# Patient Record
Sex: Female | Born: 1955 | Race: White | Hispanic: No | Marital: Single | Smoking: Former smoker
Health system: Southern US, Community
[De-identification: ages and names within clinical notes are randomized; demographics above are authoritative.]

## PROBLEM LIST (undated history)

## (undated) DIAGNOSIS — K219 Gastro-esophageal reflux disease without esophagitis: Secondary | ICD-10-CM

## (undated) DIAGNOSIS — R519 Headache, unspecified: Secondary | ICD-10-CM

## (undated) DIAGNOSIS — G4730 Sleep apnea, unspecified: Secondary | ICD-10-CM

## (undated) DIAGNOSIS — M1990 Unspecified osteoarthritis, unspecified site: Secondary | ICD-10-CM

## (undated) DIAGNOSIS — R51 Headache: Secondary | ICD-10-CM

## (undated) DIAGNOSIS — F419 Anxiety disorder, unspecified: Secondary | ICD-10-CM

## (undated) DIAGNOSIS — I10 Essential (primary) hypertension: Secondary | ICD-10-CM

## (undated) HISTORY — DX: Essential (primary) hypertension: I10

## (undated) HISTORY — PX: NASAL SINUS SURGERY: SHX719

## (undated) HISTORY — PX: CHOLECYSTECTOMY: SHX55

## (undated) HISTORY — PX: ABDOMINAL HYSTERECTOMY: SHX81

## (undated) HISTORY — PX: THROAT SURGERY: SHX803

## (undated) HISTORY — PX: TUBAL LIGATION: SHX77

## (undated) HISTORY — PX: OTHER SURGICAL HISTORY: SHX169

---

## 1998-08-31 ENCOUNTER — Encounter: Payer: Self-pay | Admitting: Specialist

## 1998-08-31 ENCOUNTER — Ambulatory Visit (HOSPITAL_COMMUNITY): Admission: RE | Admit: 1998-08-31 | Discharge: 1998-08-31 | Payer: Self-pay | Admitting: Specialist

## 1999-06-17 ENCOUNTER — Ambulatory Visit (HOSPITAL_COMMUNITY): Admission: RE | Admit: 1999-06-17 | Discharge: 1999-06-17 | Payer: Self-pay | Admitting: *Deleted

## 2000-09-01 ENCOUNTER — Encounter: Payer: Self-pay | Admitting: Pulmonary Disease

## 2000-09-01 ENCOUNTER — Ambulatory Visit (HOSPITAL_COMMUNITY): Admission: RE | Admit: 2000-09-01 | Discharge: 2000-09-01 | Payer: Self-pay | Admitting: Pulmonary Disease

## 2000-10-06 ENCOUNTER — Ambulatory Visit (HOSPITAL_COMMUNITY): Admission: RE | Admit: 2000-10-06 | Discharge: 2000-10-06 | Payer: Self-pay | Admitting: Gastroenterology

## 2000-11-23 ENCOUNTER — Encounter: Payer: Self-pay | Admitting: Surgery

## 2000-11-25 ENCOUNTER — Observation Stay (HOSPITAL_COMMUNITY): Admission: EM | Admit: 2000-11-25 | Discharge: 2000-11-27 | Payer: Self-pay | Admitting: Surgery

## 2001-04-20 ENCOUNTER — Encounter: Admission: RE | Admit: 2001-04-20 | Discharge: 2001-04-20 | Payer: Self-pay | Admitting: Family Medicine

## 2001-04-20 ENCOUNTER — Encounter: Payer: Self-pay | Admitting: Family Medicine

## 2001-09-07 ENCOUNTER — Encounter: Payer: Self-pay | Admitting: Family Medicine

## 2001-09-07 ENCOUNTER — Encounter: Admission: RE | Admit: 2001-09-07 | Discharge: 2001-09-07 | Payer: Self-pay | Admitting: Family Medicine

## 2004-12-30 ENCOUNTER — Encounter: Admission: RE | Admit: 2004-12-30 | Discharge: 2004-12-30 | Payer: Self-pay | Admitting: Gastroenterology

## 2005-05-26 ENCOUNTER — Encounter: Admission: RE | Admit: 2005-05-26 | Discharge: 2005-05-26 | Payer: Self-pay | Admitting: Family Medicine

## 2007-08-27 ENCOUNTER — Encounter: Admission: RE | Admit: 2007-08-27 | Discharge: 2007-08-27 | Payer: Self-pay | Admitting: Family Medicine

## 2008-09-11 ENCOUNTER — Encounter: Admission: RE | Admit: 2008-09-11 | Discharge: 2008-09-11 | Payer: Self-pay | Admitting: Family Medicine

## 2009-09-20 ENCOUNTER — Encounter: Admission: RE | Admit: 2009-09-20 | Discharge: 2009-09-20 | Payer: Self-pay | Admitting: Family Medicine

## 2010-10-20 ENCOUNTER — Encounter: Payer: Self-pay | Admitting: Family Medicine

## 2011-01-20 ENCOUNTER — Ambulatory Visit
Admission: RE | Admit: 2011-01-20 | Discharge: 2011-01-20 | Disposition: A | Discharge status: Home / Self Care | Payer: BC Managed Care – PPO | Source: Ambulatory Visit | Attending: Family Medicine | Admitting: Family Medicine

## 2011-01-20 ENCOUNTER — Other Ambulatory Visit: Payer: Self-pay | Admitting: Family Medicine

## 2011-01-20 DIAGNOSIS — R51 Headache: Secondary | ICD-10-CM

## 2011-02-14 NOTE — Procedures (Signed)
[facility]  Patient:    [name], [name]                       MRN: [id number] Proc. Date: 10/08/00 Adm. Date:  [id number] Disc. Date: [id number] Attending:  Judeth Cornfield                           Procedure Report  HISTORY:  Mrs. Zenz underwent an esophageal manometry because of chronic reflux.  Test is performed with a pull-through technique.  FINDINGS: 1. Upper esophageal sphincter pressure and relaxation were normal. 2. There were normal peristaltic contractions throughout the body of the    esophagus. 3. LES resting pressure was 49% with 85% relaxation.  IMPRESSION:  Normal esophageal manometry. DD:  10/08/00 TD:  10/08/00 Job: [id number]

## 2011-02-14 NOTE — Discharge Summary (Signed)
[facility]  Patient:    [name], [name]                       MRN: [id number] Adm. Date:  [id number] Disc. Date: [id number] Attending:  Katha Cabal                           Discharge Summary  ADMISSION DIAGNOSIS:  Gastroesophageal reflux disease.  DISCHARGE DIAGNOSIS:  Gastroesophageal reflux disease, status post laparoscopic Nissen fundoplication.  COURSE IN THE HOSPITAL:  [name] was an a.m. admission on November 25, 2000, and underwent a laparoscopic Nissen fundoplication over a #56 dilator on November 25, 2000.  She did well.  She was begun on liquids on November 26, 2000, and is ready for discharge on November 27, 2000.  CONDITION ON DISCHARGE:  Good. DD:  11/27/00 TD:  11/29/00 Job: [id number]

## 2011-02-14 NOTE — Op Note (Signed)
[facility]  Patient:    [name], [name]                       MRN: [id number] Proc. Date: 11/25/00 Adm. Date:  [id number] Attending:  Katha Cabal CC:         Dr. Carley Hammed D. Arlyce Dice, M.D. [facility]  Teena Irani. Arlyce Dice, M.D.  Veverly Fells. Arletha Grippe, M.D.   Operative Report  CCS#:  [id number]  PREOPERATIVE DIAGNOSES:  Gastroesophageal reflux disease with hoarseness.  POSTOPERATIVE DIAGNOSES:  Gastroesophageal reflux disease with hoarseness.  PROCEDURE:  Laparoscopic Nissen fundoplication over a #56 dilator with a f08 and simple suture closure of the diaphragm and the wrap tacked to the right crus.  SURGEON:  Luretha Murphy.  ASSISTANT:  [place].  ANESTHESIA:  General endotracheal after fiberoptic intubation.  PREOPERATIVE INDICATIONS:  [name] is a 55 year old lady with a history of acid reflux going back at least five or six years. She has had water brash and coughing and more problems with hoarseness. She has been taking proton pump inhibitors but will have burning in her stomach as well as frank heartburn. Manometry showed good motility and a barium swallow showed free reflux up to the cervical esophagus in the supine position. Informed consent was obtained regarding laparoscopic as well as open Nissen fundoplication and the risk of not only perforation of esophagus and stomach but wrap failure, splenectomy.  Ms. Biskup was taken to the room 1 on Wednesday, February 27 and given general anesthesia. PAS hose were in place. Dr. Rica Mast needed fiberoptics to get her intubated since she has a difficult airway to manage. The abdomen was then prepped with Betadine and draped sterilely. A longitudinal incision was made just above the umbilicus and the abdomen was entered using Hasson technique without difficulty. A 5 mm was placed in the upper midline to retract the liver and then two 10/11s were placed on the right side and one in the  left upper quadrant. Through these ports, we first exposed the esophagogastric junction. I incised the hepatogastric ligament and carried this north to the crus. The right crus and the esophagus demonstrated a marked degree of inflammatory change much more than most people. She didnt have a significant hiatal hernia but it was striking the degree of inflammatory change there. With careful dissection the crurae were teased from the esophagus where the fibers had a tendency to stick to the esophagus and the esophagogastric junction was developed. I then went over and took down the short gastrics as the cardia was intimately associated with the spleen and this was performed and enabled me to completely dissect the left crus. I was then able to get behind the esophagus and we placed a Penrose drain in that area and this further enabled me to delineate the retroesophageal space and take down a lot of the stringy adhesions and inflammatory changes which were chronic and more to the left and right crus. The diaphragmatic crura were then approximated first with a figure-of-eight suture using the endostitch and tied extracorporeally and then with the simple above that. This seemed to snug up the crura nicely. A #56 lighted bougie was passed by Dr. Rica Mast without difficulty. The fundus and cardia were identified and a contiguous portion of this cardiofundic region was brought around behind the esophagus and then it was sutured with three stitches each taking a bite of the esophagus and these were tied down, the top one extracorporeally followed  by two intracorporeal stitches. Nice placement of the wrap was felt to be present. Each suture had a clip on it. When the wrap was complete, I removed the dilator and tacked the wrapped portion of the stomach to the right crus with a single simple suture.  Everything looked good. The patient seemed to tolerate the procedure well. The port sites were all  inspected as I withdrew the various trocars. The umbilical port was repaired under direct vision. Everything seemed to be in order and the patient seemed to tolerate this procedure well. Once deflated, the abdominal skin wounds were closed with 4-0 Vicryl and Benzoin and Steri-Strips. The patient seemed to tolerate the procedure well and was taken to the recovery room in satisfactory condition. DD:  11/25/00 TD:  11/25/00 Job: [id number]

## 2011-02-14 NOTE — Procedures (Signed)
[facility]  Patient:    [name], [name]                       MRN: [id number] Proc. Date: 10/06/00 Adm. Date:  [id number] Disc. Date: [id number] Attending:  Judeth Cornfield CC:         [place] Daphine Deutscher, M.D.   Procedure Report  PROCEDURE:  Esophageal manometry was performed in the usual fashion.  FINDINGS: 1. Upper esophageal sphincter pressure and relaxation were normal. 2. There were normal peristaltic contractions throughout the body of the    esophagus. 3. LES resting pressure is 49.3 mmHg (normal 10-45).  Percent relaxation was    85%.  IMPRESSION:  Normal esophageal manometry. DD:  10/08/00 TD:  10/09/00 Job: [id number]

## 2011-04-14 ENCOUNTER — Ambulatory Visit (HOSPITAL_BASED_OUTPATIENT_CLINIC_OR_DEPARTMENT_OTHER)
Admission: RE | Admit: 2011-04-14 | Discharge: 2011-04-14 | Disposition: A | Discharge status: Home / Self Care | Payer: BC Managed Care – PPO | Source: Ambulatory Visit | Attending: Orthopedic Surgery | Admitting: Orthopedic Surgery

## 2011-04-14 DIAGNOSIS — IMO0002 Reserved for concepts with insufficient information to code with codable children: Secondary | ICD-10-CM | POA: Insufficient documentation

## 2011-04-14 DIAGNOSIS — X58XXXA Exposure to other specified factors, initial encounter: Secondary | ICD-10-CM | POA: Insufficient documentation

## 2011-04-14 DIAGNOSIS — Z01810 Encounter for preprocedural cardiovascular examination: Secondary | ICD-10-CM | POA: Insufficient documentation

## 2011-04-14 DIAGNOSIS — M2240 Chondromalacia patellae, unspecified knee: Secondary | ICD-10-CM | POA: Insufficient documentation

## 2011-04-14 DIAGNOSIS — Z01812 Encounter for preprocedural laboratory examination: Secondary | ICD-10-CM | POA: Insufficient documentation

## 2011-04-14 DIAGNOSIS — M6750 Plica syndrome, unspecified knee: Secondary | ICD-10-CM | POA: Insufficient documentation

## 2011-04-14 LAB — POCT I-STAT 4, (NA,K, GLUC, HGB,HCT): Potassium: 3.7 mEq/L (ref 3.5–5.1)

## 2011-04-17 NOTE — Op Note (Signed)
NAMEARLESIA, [name]                ACCOUNT NO.:  [id number]  MEDICAL RECORD NO.:  [id number]  LOCATION:                                 FACILITY:  PHYSICIAN:  Marlowe Kays, M.D.  DATE OF BIRTH:  [birth year]  DATE OF PROCEDURE:  04/14/2011 DATE OF DISCHARGE:                              OPERATIVE REPORT   PREOPERATIVE DIAGNOSES: 1. Torn medial meniscus. 2. Chondromalacia of the patella.  POSTOPERATIVE DIAGNOSES: 1. Torn medial meniscus. 2. 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 3. Large medial and lateral suprapatellar plicae, right knee.  OPERATION:  Right knee arthroscopy with: 1. Partial medial meniscectomy. 2. Shaving of medial femoral condyle. 3. Shaving of patella. 4. Excision of large medial and lateral suprapatellar plicae.  SURGEON:  Marlowe Kays, MD  ASSISTANT:  Nurse.  ANESTHESIA:  General.  PATHOLOGY AND JUSTIFICATION FOR PROCEDURE:  Because of the painful right knee, I sent her for an MRI which was performed on April 01, 2011, demonstrating preoperative diagnoses.  DESCRIPTION OF PROCEDURE:  Latex precaution, satisfactory general anesthesia, Ace wrap to left lower extremity, pneumatic tourniquet to right lower extremity with the leg Esmarch'd out nonsterilely and tourniquet inflated to 300 mmHg, thigh stabilizer applied.  Right leg was then prepped from stabilizer to ankle with DuraPrep and draped in sterile field.  Superomedial saline inflow.  First an anterolateral portal of medial compartment of knee joint was evaluated.  There was some minimal irritation of the anterior medial meniscus and what appeared to be a fairly benign looking minimal chondromalacia of the medial femoral condyle.  On trying to smooth this down, however, the articular cartilage was just very unstable and came off in sheaths down to the underlying bone.  All this was smoothed down as gently as possible.  Looking posteriorly, there was a tear of so called  parrot- beak variety as depicted on the MRI at the posterior curve and I resected this back to a stable rim with a combination of 3.5 shaver and baskets.  I worked back through the posterior horn smoothing down the meniscus with final pictures being taken.  Looking up the medial gutter and suprapatellar area, she had large plicae both medially and laterally, which I partially resected through these 2 portals, but then I had to reverse portals to completely remove both of the plicae.  She also had some wear of the patella as noted on the MRI and I smoothed it down as well.  Lateral compartment of knee joint and ACL looked normal. The knee joint was then cleared of all fluid and the 2 entry portals closed with 4-0 nylon.  I then injected through the inflow apparatus, 20 cc of 0.5% Marcaine with adrenaline, 4 mg of morphine. This portal I then closed with 4-0 nylon as well.  Betadine, Adaptic, dry sterile dressing were applied.  Tourniquet was released.  She tolerated the procedure well, was taken to recovery room in satisfactory condition with no known complications.          ______________________________ Marlowe Kays, M.D.     JA/MEDQ  D:  04/14/2011  T:  04/14/2011  Job:  [id number]  Electronically Signed by Nolen Mu.D.  on 04/17/2011 12:59:22 PM

## 2013-05-23 ENCOUNTER — Other Ambulatory Visit: Payer: Self-pay | Admitting: Family Medicine

## 2013-05-23 DIAGNOSIS — Z1231 Encounter for screening mammogram for malignant neoplasm of breast: Secondary | ICD-10-CM

## 2013-05-26 ENCOUNTER — Ambulatory Visit (INDEPENDENT_AMBULATORY_CARE_PROVIDER_SITE_OTHER): Payer: BC Managed Care – PPO

## 2013-05-26 DIAGNOSIS — Z1231 Encounter for screening mammogram for malignant neoplasm of breast: Secondary | ICD-10-CM

## 2013-06-01 ENCOUNTER — Other Ambulatory Visit: Payer: Self-pay | Admitting: Family Medicine

## 2013-06-01 DIAGNOSIS — R928 Other abnormal and inconclusive findings on diagnostic imaging of breast: Secondary | ICD-10-CM

## 2013-06-20 ENCOUNTER — Ambulatory Visit
Admission: RE | Admit: 2013-06-20 | Discharge: 2013-06-20 | Disposition: A | Discharge status: Home / Self Care | Payer: BC Managed Care – PPO | Source: Ambulatory Visit

## 2013-06-20 ENCOUNTER — Ambulatory Visit
Admission: RE | Admit: 2013-06-20 | Discharge: 2013-06-20 | Disposition: A | Discharge status: Home / Self Care | Payer: BC Managed Care – PPO | Source: Ambulatory Visit | Attending: Family Medicine | Admitting: Family Medicine

## 2013-06-20 ENCOUNTER — Other Ambulatory Visit: Payer: Self-pay | Admitting: Family Medicine

## 2013-06-20 DIAGNOSIS — R928 Other abnormal and inconclusive findings on diagnostic imaging of breast: Secondary | ICD-10-CM

## 2014-04-06 LAB — BASIC METABOLIC PANEL
BUN: 85 mg/dL — AB (ref 4–21)
CREATININE: 15 mg/dL — AB (ref 0.5–1.1)
Glucose: 10 mg/dL
Potassium: 4 mmol/L (ref 3.4–5.3)
Sodium: 143 mmol/L (ref 137–147)

## 2014-04-06 LAB — HEPATIC FUNCTION PANEL
ALT: 15 U/L (ref 7–35)
AST: 16 U/L (ref 13–35)
BILIRUBIN, TOTAL: 0.3 mg/dL

## 2014-04-06 LAB — ERYTHROCYTE SEDIMENTATION RATE: SED RATE: 30 mm

## 2014-08-29 LAB — CBC AND DIFFERENTIAL
HEMOGLOBIN: 14 g/dL (ref 12.0–16.0)
PLATELETS: 251 10*3/uL (ref 150–399)
WBC: 9.4 10*3/mL

## 2014-08-29 LAB — TSH: TSH: 1.29 u[IU]/mL (ref 0.41–5.90)

## 2014-08-30 LAB — LIPID PANEL
Cholesterol: 206 mg/dL — AB (ref 0–200)
HDL: 52 mg/dL (ref 35–70)
LDL CALC: 130 mg/dL
Triglycerides: 121 mg/dL (ref 40–160)

## 2014-08-30 LAB — COMPREHENSIVE METABOLIC PANEL
Calcium: 10.1 mg/dL
Chloride: 101 mmol/L

## 2014-12-05 ENCOUNTER — Encounter: Payer: Self-pay | Admitting: Physician Assistant

## 2014-12-05 ENCOUNTER — Ambulatory Visit (INDEPENDENT_AMBULATORY_CARE_PROVIDER_SITE_OTHER): Payer: 59 | Admitting: Physician Assistant

## 2014-12-05 VITALS — BP 125/72 | HR 99 | Ht 63.0 in | Wt 208.0 lb

## 2014-12-05 DIAGNOSIS — I10 Essential (primary) hypertension: Secondary | ICD-10-CM | POA: Diagnosis not present

## 2014-12-05 DIAGNOSIS — R635 Abnormal weight gain: Secondary | ICD-10-CM | POA: Insufficient documentation

## 2014-12-05 DIAGNOSIS — F419 Anxiety disorder, unspecified: Secondary | ICD-10-CM | POA: Diagnosis not present

## 2014-12-05 DIAGNOSIS — E669 Obesity, unspecified: Secondary | ICD-10-CM | POA: Diagnosis not present

## 2014-12-05 DIAGNOSIS — J309 Allergic rhinitis, unspecified: Secondary | ICD-10-CM | POA: Diagnosis not present

## 2014-12-05 DIAGNOSIS — E785 Hyperlipidemia, unspecified: Secondary | ICD-10-CM | POA: Insufficient documentation

## 2014-12-05 MED ORDER — PHENTERMINE HCL 37.5 MG PO TABS: 37.5000 mg | ORAL_TABLET | Freq: Every day | ORAL | Status: DC

## 2014-12-05 NOTE — Patient Instructions (Signed)
Belviq/contrave/qysmia

## 2014-12-05 NOTE — Progress Notes (Signed)
   Subjective:    Patient ID: [name], female    DOB: 10/09/1955, 59 y.o.   MRN: [id number]  HPI Pt is a 59 yo female who presents to the clinic to establish care.   .. Active Ambulatory Problems    Diagnosis Date Noted  . Essential hypertension, benign 12/05/2014  . Rhinitis, allergic 12/05/2014  . Acute anxiety 12/05/2014  . Obesity 12/05/2014  . Abnormal weight gain 12/05/2014   Resolved Ambulatory Problems    Diagnosis Date Noted  . No Resolved Ambulatory Problems   Past Medical History  Diagnosis Date  . Hypertension    .Marland Kitchen. Family History  Problem Relation Age of Onset  . Cancer Mother     lung  . Hypertension Mother   . Cancer Paternal Aunt     ovarian  . Hypertension Paternal Aunt   . Diabetes Maternal Grandmother   . Hypertension Maternal Grandmother   . Diabetes Paternal Grandmother   . Hypertension Paternal Grandmother   . Diabetes Paternal Grandfather   . Hypertension Paternal Grandfather    .Marland Kitchen. History   Social History  . Marital Status: Single    Spouse Name: N/A  . Number of Children: N/A  . Years of Education: N/A   Occupational History  . Not on file.   Social History Main Topics  . Smoking status: Never Smoker   . Smokeless tobacco: Not on file  . Alcohol Use: 0.0 oz/week    0 Standard drinks or equivalent per week  . Drug Use: No  . Sexual Activity: Not Currently   Other Topics Concern  . Not on file   Social History Narrative  . No narrative on file   Pt would like to continue phentermine and working on weight loss. Tried phentermine in the past and did really well. Just joined [employer] and exercising regularly.    Review of Systems  All other systems reviewed and are negative.      Objective:   Physical Exam  Constitutional: She is oriented to person, place, and time. She appears well-developed and well-nourished.  HENT:  Head: Normocephalic and atraumatic.  Cardiovascular: Normal rate, regular rhythm and  normal heart sounds.   Pulmonary/Chest: Effort normal and breath sounds normal.  Neurological: She is alert and oriented to person, place, and time.  Skin: Skin is dry.  Psychiatric: She has a normal mood and affect. Her behavior is normal.          Assessment & Plan:  HTN- controlled, does not need refills today.   Acute anxiety- xanax as needed once a month or so. Does not need refills.   Obesity/abnormal weight gain- restart phentermine. Discussed side effects. Follow up in 2 months. Discussed long term weight loss options. Exercise at least 150 minutes a week. 1500 calorie diet.   Hyperlipidemia- LDL 130. Work on diet and exercise.   Will scan in labs from [facility]. Done 08/2014.

## 2014-12-13 ENCOUNTER — Encounter: Payer: Self-pay | Admitting: Physician Assistant

## 2015-01-26 ENCOUNTER — Ambulatory Visit (INDEPENDENT_AMBULATORY_CARE_PROVIDER_SITE_OTHER): Payer: 59 | Admitting: Physician Assistant

## 2015-01-26 ENCOUNTER — Encounter: Payer: Self-pay | Admitting: Physician Assistant

## 2015-01-26 VITALS — BP 100/65 | HR 83 | Ht 63.0 in | Wt 205.0 lb

## 2015-01-26 DIAGNOSIS — I10 Essential (primary) hypertension: Secondary | ICD-10-CM

## 2015-01-26 DIAGNOSIS — R635 Abnormal weight gain: Secondary | ICD-10-CM | POA: Diagnosis not present

## 2015-01-26 DIAGNOSIS — E669 Obesity, unspecified: Secondary | ICD-10-CM

## 2015-01-26 MED ORDER — PHENTERMINE HCL 37.5 MG PO TABS: 37.5000 mg | ORAL_TABLET | Freq: Every day | ORAL | Status: DC

## 2015-01-28 MED ORDER — LISINOPRIL-HYDROCHLOROTHIAZIDE 20-25 MG PO TABS: 1.0000 | ORAL_TABLET | Freq: Every day | ORAL | Status: DC

## 2015-01-28 NOTE — Progress Notes (Signed)
   Subjective:    Patient ID: [name], female    DOB: 05/26/1956, 59 y.o.   MRN: [id number]  HPI Pt presents to the clinic to follow up on phentermine and weight loss. She is down 3lbs since starting. Pt has limited her diet and watching what she eats but is not exercising. She plans to start at [employer] exercising this month. Denies any side effects of insomnia, anxiety, dry mouth, constipation. Mood changes. She isn't happy with results she did have more success last time used.   HTN- taking medications. Denies any hypotensive symptoms. No problems or concerns.    Review of Systems  All other systems reviewed and are negative.      Objective:   Physical Exam  Constitutional: She is oriented to person, place, and time. She appears well-developed and well-nourished.  Obese.  HENT:  Head: Normocephalic and atraumatic.  Cardiovascular: Normal rate, regular rhythm and normal heart sounds.   Pulmonary/Chest: Effort normal and breath sounds normal.  Neurological: She is alert and oriented to person, place, and time.  Psychiatric: She has a normal mood and affect. Her behavior is normal.          Assessment & Plan:  Obesity/abnormal weight gain- refilled phentermine. Discussed did not lose significant amt of weight. Needs to add exercise with diet changes. Follow up in 2 months since vitals controlled. If not losing weight need to consider other options.   HTN- BP low today. Keep a check on it. If remaining that low cut lisinopril/HCTZ in half. If running below 110/70 alert us. Discussed hypotension symptoms. If having please alert us.

## 2015-06-06 ENCOUNTER — Ambulatory Visit: Payer: 59 | Admitting: Physician Assistant

## 2015-06-22 ENCOUNTER — Encounter: Payer: Self-pay | Admitting: Physician Assistant

## 2015-06-22 ENCOUNTER — Ambulatory Visit (INDEPENDENT_AMBULATORY_CARE_PROVIDER_SITE_OTHER): Payer: 59 | Admitting: Physician Assistant

## 2015-06-22 VITALS — BP 111/50 | HR 85 | Ht 63.0 in | Wt 208.0 lb

## 2015-06-22 DIAGNOSIS — R635 Abnormal weight gain: Secondary | ICD-10-CM | POA: Diagnosis not present

## 2015-06-22 DIAGNOSIS — E669 Obesity, unspecified: Secondary | ICD-10-CM | POA: Diagnosis not present

## 2015-06-22 MED ORDER — TOPIRAMATE 25 MG PO TABS: 25.0000 mg | ORAL_TABLET | Freq: Two times a day (BID) | ORAL | Status: DC

## 2015-06-22 MED ORDER — PHENTERMINE HCL 37.5 MG PO TABS: 37.5000 mg | ORAL_TABLET | Freq: Every day | ORAL | Status: DC

## 2015-06-22 NOTE — Progress Notes (Signed)
   Subjective:    Patient ID: [name], female    DOB: [birth year], 59 y.o.   MRN: [id number]  HPI Patient is a 59 year old female who presents to the clinic to follow-up on abnormal weight gain. In April she tried phentermine for 2 months. She experienced a minimal weight loss. She feels like she was not doing everything she could to lose weight at that time. She denies any side effects concerns with medication. She is very pressured with her weight today. She is walking daily and trying to stick to a healthy diet. She feels like she needs a lot of salads and grilled protein. She is not counting her calories.   Review of Systems  All other systems reviewed and are negative.      Objective:   Physical Exam  Constitutional: She is oriented to person, place, and time. She appears well-developed and well-nourished.  Obesity  HENT:  Head: Normocephalic and atraumatic.  Cardiovascular: Normal rate, regular rhythm and normal heart sounds.   Pulmonary/Chest: Effort normal and breath sounds normal.  Neurological: She is alert and oriented to person, place, and time.  Psychiatric: She has a normal mood and affect. Her behavior is normal.          Assessment & Plan:  Abnormal weight gain/obesity-we'll start phentermine 37.5 mg tablet with Topamax 25 mg twice a day. Discussed side effects of both medications. Hopefully the combination will yield some weight loss. Encouraged patient to continue exercising daily as well as keeping to a 1500-calorie diet. I also gave her a list of other medications we could try for weight loss. She does not have any history of elevated glucose therefore I do not think Victoza would be approved. I think the effects and it could be expensive. We also mentioned the possibility of referring to Dr. Cathey Endow at a bariatric clinic to work with her more focused weight on her weight loss.

## 2015-06-22 NOTE — Patient Instructions (Signed)
Belviq/contrave/saxenda 

## 2015-11-28 DIAGNOSIS — F329 Major depressive disorder, single episode, unspecified: Secondary | ICD-10-CM | POA: Insufficient documentation

## 2015-11-28 DIAGNOSIS — G43909 Migraine, unspecified, not intractable, without status migrainosus: Secondary | ICD-10-CM | POA: Insufficient documentation

## 2015-11-28 DIAGNOSIS — F32A Depression, unspecified: Secondary | ICD-10-CM | POA: Insufficient documentation

## 2015-11-28 DIAGNOSIS — I10 Essential (primary) hypertension: Secondary | ICD-10-CM | POA: Insufficient documentation

## 2015-11-28 DIAGNOSIS — M722 Plantar fascial fibromatosis: Secondary | ICD-10-CM | POA: Insufficient documentation

## 2015-11-28 DIAGNOSIS — M216X9 Other acquired deformities of unspecified foot: Secondary | ICD-10-CM | POA: Insufficient documentation

## 2016-03-04 ENCOUNTER — Other Ambulatory Visit: Payer: Self-pay | Admitting: Specialist

## 2016-03-04 DIAGNOSIS — M25561 Pain in right knee: Secondary | ICD-10-CM

## 2016-03-04 DIAGNOSIS — M1711 Unilateral primary osteoarthritis, right knee: Secondary | ICD-10-CM

## 2016-03-05 ENCOUNTER — Ambulatory Visit
Admission: RE | Admit: 2016-03-05 | Discharge: 2016-03-05 | Disposition: A | Discharge status: Home / Self Care | Payer: 59 | Source: Ambulatory Visit | Attending: Specialist | Admitting: Specialist

## 2016-03-05 DIAGNOSIS — M25561 Pain in right knee: Secondary | ICD-10-CM

## 2016-03-05 DIAGNOSIS — M1711 Unilateral primary osteoarthritis, right knee: Secondary | ICD-10-CM

## 2016-06-21 ENCOUNTER — Other Ambulatory Visit: Payer: Self-pay | Admitting: Physician Assistant

## 2016-10-06 ENCOUNTER — Ambulatory Visit (INDEPENDENT_AMBULATORY_CARE_PROVIDER_SITE_OTHER): Payer: 59 | Admitting: Physician Assistant

## 2016-10-06 ENCOUNTER — Encounter: Payer: Self-pay | Admitting: Physician Assistant

## 2016-10-06 VITALS — BP 146/93 | HR 82 | Ht 63.0 in | Wt 230.0 lb

## 2016-10-06 DIAGNOSIS — E6609 Other obesity due to excess calories: Secondary | ICD-10-CM | POA: Diagnosis not present

## 2016-10-06 DIAGNOSIS — M25561 Pain in right knee: Secondary | ICD-10-CM

## 2016-10-06 DIAGNOSIS — G8929 Other chronic pain: Secondary | ICD-10-CM

## 2016-10-06 DIAGNOSIS — I10 Essential (primary) hypertension: Secondary | ICD-10-CM | POA: Diagnosis not present

## 2016-10-06 MED ORDER — LIRAGLUTIDE -WEIGHT MANAGEMENT 18 MG/3ML ~~LOC~~ SOPN: 0.6000 mg | PEN_INJECTOR | Freq: Every day | SUBCUTANEOUS | 1 refills | Status: DC

## 2016-10-06 NOTE — Patient Instructions (Signed)
Liraglutide injection What is this medicine? LIRAGLUTIDE (LIR a GLOO tide) is used to improve blood sugar control in adults with type 2 diabetes. This medicine may be used with other oral diabetes medicines. COMMON BRAND NAME(S): Victoza What should I tell my health care provider before I take this medicine? They need to know if you have any of these conditions: -endocrine tumors (MEN 2) or if someone in your family had these tumors -gallbladder disease -high cholesterol -history of alcohol abuse problem -history of pancreatitis -kidney disease or if you are on dialysis -liver disease -previous swelling of the tongue, face, or lips with difficulty breathing, difficulty swallowing, hoarseness, or tightening of the throat -stomach problems -thyroid cancer or if someone in your family had thyroid cancer -an unusual or allergic reaction to liraglutide, other medicines, foods, dyes, or preservatives -pregnant or trying to get pregnant -breast-feeding How should I use this medicine? This medicine is for injection under the skin of your upper leg, stomach area, or upper arm. You will be taught how to prepare and give this medicine. Use exactly as directed. Take your medicine at regular intervals. Do not take it more often than directed. It is important that you put your used needles and syringes in a special sharps container. Do not put them in a trash can. If you do not have a sharps container, call your pharmacist or healthcare provider to get one. A special MedGuide will be given to you by the pharmacist with each prescription and refill. Be sure to read this information carefully each time. Talk to your pediatrician regarding the use of this medicine in children. Special care may be needed. What if I miss a dose? If you miss a dose, take it as soon as you can. If it is almost time for your next dose, take only that dose. Do not take double or extra doses. What may interact with this  medicine? -acetaminophen -atorvastatin -birth control pills -digoxin -griseofulvin -lisinopril Many medications may cause changes in blood sugar, these include: -alcohol -aspirin and aspirin-like drugs -chloramphenicol -chromium -diuretics -female hormones, such as estrogens or progestins, birth control pills -heart medicines -isoniazid -female hormones or anabolic steroids -medications for weight loss -medicines for allergies, asthma, cold, or cough -medicines for mental problems -medicines called MAO inhibitors - Nardil, Parnate, Marplan, Eldepryl -niacin -NSAIDS, such as ibuprofen -pentamidine -phenytoin -probenecid -quinolone antibiotics such as ciprofloxacin, levofloxacin, ofloxacin -some herbal dietary supplements -steroid medicines such as prednisone or cortisone -thyroid hormones Some medications can hide the warning symptoms of low blood sugar (hypoglycemia). You may need to monitor your blood sugar more closely if you are taking one of these medications. These include: -beta-blockers, often used for high blood pressure or heart problems (examples include atenolol, metoprolol, propranolol) -clonidine -guanethidine -reserpine What should I watch for while using this medicine? Visit your doctor or health care professional for regular checks on your progress. A test called the HbA1C (A1C) will be monitored. This is a simple blood test. It measures your blood sugar control over the last 2 to 3 months. You will receive this test every 3 to 6 months. Learn how to check your blood sugar. Learn the symptoms of low and high blood sugar and how to manage them. Always carry a quick-source of sugar with you in case you have symptoms of low blood sugar. Examples include hard sugar candy or glucose tablets. Make sure others know that you can choke if you eat or drink when you develop serious symptoms of  low blood sugar, such as seizures or unconsciousness. They must get medical help  at once. Tell your doctor or health care professional if you have high blood sugar. You might need to change the dose of your medicine. If you are sick or exercising more than usual, you might need to change the dose of your medicine. Do not skip meals. Ask your doctor or health care professional if you should avoid alcohol. Many nonprescription cough and cold products contain sugar or alcohol. These can affect blood sugar. Liraglutide pens and cartridges should never be shared. Even if the needle is changed, sharing may result in passing of viruses like hepatitis or HIV. Wear a medical ID bracelet or chain, and carry a card that describes your disease and details of your medicine and dosage times. What side effects may I notice from receiving this medicine? Side effects that you should report to your doctor or health care professional as soon as possible: -allergic reactions like skin rash, itching or hives, swelling of the face, lips, or tongue -breathing problems -fever, chills -loss of appetite -signs and symptoms of low blood sugar such as feeling anxious, confusion, dizziness, increased hunger, unusually weak or tired, sweating, shakiness, cold, irritable, headache, blurred vision, fast heartbeat, loss of consciousness -trouble passing urine or change in the amount of urine -unusual stomach pain or upset -vomiting Side effects that usually do not require medical attention (report to your doctor or health care professional if they continue or are bothersome): -constipation -diarrhea -fatigue -headache -nausea Where should I keep my medicine? Keep out of the reach of children. Store unopened pen in a refrigerator between 2 and 8 degrees C (36 and 46 degrees F). Do not freeze or use if the medicine has been frozen. Protect from light and excessive heat. After you first use the pen, it can be stored at room temperature between 15 and 30 degrees C (59 and 86 degrees F) or in a refrigerator.  Throw away your used pen after 30 days or after the expiration date, whichever comes first. Do not store your pen with the needle attached. If the needle is left on, medicine may leak from the pen.  2017 Elsevier/Gold Standard (2015-11-02 11:56:00)

## 2016-10-06 NOTE — Progress Notes (Signed)
   Subjective:    Patient ID: [name], female    DOB: 02/03/1956, 61 y.o.   MRN: [id number]  HPI   Pt is a 61 yo female who presents to the clinic to discuss weight. She has gained from 208 to 230. She has chronic knee pain which prevents her from exercise. She tried phentermine and topamax for one month but did not come back for follow up. She is ambulatory at work. She had arthoscopic procedure last year on right knee but NO improvement. She is very agitated about this.    Review of Systems    see HPI>  Objective:   Physical Exam  Constitutional: She is oriented to person, place, and time. She appears well-developed and well-nourished.  Obese.   HENT:  Head: Normocephalic and atraumatic.  Cardiovascular: Normal rate, regular rhythm and normal heart sounds.   Pulmonary/Chest: Effort normal and breath sounds normal.  Neurological: She is alert and oriented to person, place, and time.  Psychiatric: She has a normal mood and affect. Her behavior is normal.          Assessment & Plan:  Marland Kitchen.Marland Kitchen.[name] was seen today for obesity.  Diagnoses and all orders for this visit:  Essential hypertension, benign  Chronic pain of right knee  Obesity due to excess calories with serious comorbidity, unspecified classification -     Liraglutide -Weight Management (SAXENDA) 18 MG/3ML SOPN; Inject 0.6 mg into the skin daily. For one week then increase by .6mg  weekly until reaches 3mg  daily.  Please include ultra fine needles 6mm   BP not controlled today. Rechecked and better. Will continue to monitor. If staying elevated will add medications. Discussed DASH diet.   Advised to get second opinion about knee pain.   Discussed 1500 calorie diet. Discussed 150 minutes of exercise(for this patient needs to be low impact) Start saxenda. Discussed side effects.  Follow up in 6 weeks.

## 2016-10-08 ENCOUNTER — Other Ambulatory Visit: Payer: Self-pay | Admitting: *Deleted

## 2016-10-08 ENCOUNTER — Telehealth: Payer: Self-pay | Admitting: *Deleted

## 2016-10-08 DIAGNOSIS — E6609 Other obesity due to excess calories: Secondary | ICD-10-CM

## 2016-10-08 MED ORDER — LIRAGLUTIDE -WEIGHT MANAGEMENT 18 MG/3ML ~~LOC~~ SOPN: 3.0000 mg | PEN_INJECTOR | Freq: Every day | SUBCUTANEOUS | 1 refills | Status: DC

## 2016-10-08 NOTE — Telephone Encounter (Signed)
Submitted PA through covermymeds [id number] - PA Case ID: [id number]

## 2016-10-13 ENCOUNTER — Telehealth: Payer: Self-pay | Admitting: *Deleted

## 2016-10-13 NOTE — Telephone Encounter (Signed)
Pt called stating that the saxenda is too expensive & wants to know what else can be sent in for her.  Please advise.

## 2016-10-13 NOTE — Telephone Encounter (Signed)
Pt also wants to add that her bp taken today is 126/80.

## 2016-10-13 NOTE — Telephone Encounter (Signed)
Awesome that is perfect.

## 2016-10-14 NOTE — Telephone Encounter (Signed)
We can try belviq 10mg  twice a day. #60 2 refills.

## 2016-10-17 ENCOUNTER — Other Ambulatory Visit: Payer: Self-pay | Admitting: *Deleted

## 2016-10-17 MED ORDER — LORCASERIN HCL 10 MG PO TABS: 10.0000 mg | ORAL_TABLET | Freq: Two times a day (BID) | ORAL | 2 refills | Status: DC

## 2016-10-17 NOTE — Telephone Encounter (Signed)
Pt notified of new rx.

## 2016-11-07 ENCOUNTER — Ambulatory Visit: Payer: 59 | Admitting: Physician Assistant

## 2016-11-17 ENCOUNTER — Ambulatory Visit: Payer: 59 | Admitting: Physician Assistant

## 2016-11-26 ENCOUNTER — Encounter: Payer: Self-pay | Admitting: Physician Assistant

## 2016-11-26 ENCOUNTER — Telehealth: Payer: Self-pay

## 2016-11-26 ENCOUNTER — Other Ambulatory Visit: Payer: Self-pay | Admitting: Physician Assistant

## 2016-11-26 ENCOUNTER — Ambulatory Visit (INDEPENDENT_AMBULATORY_CARE_PROVIDER_SITE_OTHER): Payer: 59 | Admitting: Physician Assistant

## 2016-11-26 ENCOUNTER — Ambulatory Visit: Payer: 59

## 2016-11-26 VITALS — BP 145/83 | HR 72 | Ht 63.0 in | Wt 231.0 lb

## 2016-11-26 DIAGNOSIS — Z0000 Encounter for general adult medical examination without abnormal findings: Secondary | ICD-10-CM | POA: Diagnosis not present

## 2016-11-26 DIAGNOSIS — Z23 Encounter for immunization: Secondary | ICD-10-CM

## 2016-11-26 DIAGNOSIS — R921 Mammographic calcification found on diagnostic imaging of breast: Secondary | ICD-10-CM

## 2016-11-26 DIAGNOSIS — Z13220 Encounter for screening for lipoid disorders: Secondary | ICD-10-CM

## 2016-11-26 DIAGNOSIS — I10 Essential (primary) hypertension: Secondary | ICD-10-CM | POA: Diagnosis not present

## 2016-11-26 DIAGNOSIS — Z131 Encounter for screening for diabetes mellitus: Secondary | ICD-10-CM

## 2016-11-26 DIAGNOSIS — Z1159 Encounter for screening for other viral diseases: Secondary | ICD-10-CM | POA: Diagnosis not present

## 2016-11-26 DIAGNOSIS — Z1231 Encounter for screening mammogram for malignant neoplasm of breast: Secondary | ICD-10-CM | POA: Diagnosis not present

## 2016-11-26 DIAGNOSIS — B9689 Other specified bacterial agents as the cause of diseases classified elsewhere: Secondary | ICD-10-CM | POA: Diagnosis not present

## 2016-11-26 DIAGNOSIS — N760 Acute vaginitis: Secondary | ICD-10-CM | POA: Diagnosis not present

## 2016-11-26 DIAGNOSIS — E7800 Pure hypercholesterolemia, unspecified: Secondary | ICD-10-CM | POA: Diagnosis not present

## 2016-11-26 DIAGNOSIS — F419 Anxiety disorder, unspecified: Secondary | ICD-10-CM | POA: Diagnosis not present

## 2016-11-26 LAB — COMPLETE METABOLIC PANEL WITH GFR
ALT: 15 U/L (ref 6–29)
AST: 15 U/L (ref 10–35)
Albumin: 3.9 g/dL (ref 3.6–5.1)
Alkaline Phosphatase: 78 U/L (ref 33–130)
BUN: 22 mg/dL (ref 7–25)
CALCIUM: 9.4 mg/dL (ref 8.6–10.4)
CHLORIDE: 104 mmol/L (ref 98–110)
CO2: 22 mmol/L (ref 20–31)
Creat: 0.74 mg/dL (ref 0.50–1.05)
GFR, Est African American: >89 mL/min (ref >=60)
GFR, Est Non African American: >89 mL/min (ref >=60)
GLUCOSE: 92 mg/dL (ref 65–99)
POTASSIUM: 3.8 mmol/L (ref 3.5–5.3)
SODIUM: 146 mmol/L (ref 135–146)
Total Bilirubin: 0.5 mg/dL (ref 0.2–1.2)
Total Protein: 6.6 g/dL (ref 6.1–8.1)

## 2016-11-26 LAB — LIPID PANEL
CHOL/HDL RATIO: 3.9 ratio (ref <5.0)
Cholesterol: 177 mg/dL (ref <200)
HDL: 45 mg/dL — ABNORMAL LOW (ref >50)
LDL CALC: 103 mg/dL — AB (ref <100)
Triglycerides: 144 mg/dL (ref <150)
VLDL: 29 mg/dL (ref <30)

## 2016-11-26 LAB — WET PREP FOR TRICH, YEAST, CLUE
CLUE CELLS WET PREP: NONE SEEN
TRICH WET PREP: NONE SEEN
Yeast Wet Prep HPF POC: NONE SEEN

## 2016-11-26 LAB — TSH: TSH: 1.08 mIU/L

## 2016-11-26 MED ORDER — LISINOPRIL 40 MG PO TABS: 40.0000 mg | ORAL_TABLET | Freq: Every day | ORAL | 0 refills | Status: DC

## 2016-11-26 MED ORDER — ALPRAZOLAM 0.5 MG PO TABS: 0.5000 mg | ORAL_TABLET | Freq: Three times a day (TID) | ORAL | 4 refills | Status: DC | PRN

## 2016-11-26 MED ORDER — ALPRAZOLAM 0.5 MG PO TABS: 0.5000 mg | ORAL_TABLET | ORAL | 5 refills | Status: DC | PRN

## 2016-11-26 MED ORDER — HYDROCHLOROTHIAZIDE 25 MG PO TABS: 25.0000 mg | ORAL_TABLET | Freq: Every day | ORAL | 0 refills | Status: DC

## 2016-11-26 NOTE — Progress Notes (Signed)
Subjective:    Patient ID: [name], female    DOB: 06/24/1956, 61 y.o.   MRN: [id number]  HPI   Review of Systems     Objective:   Physical Exam        Assessment & Plan:   Subjective:     [name] is a 61 y.o. female and is here for a comprehensive physical exam. The patient reports problems - she needs refills on medications and to discuss weight loss. she is not exericing or dieting. .  Social History   Social History  . Marital status: Single    Spouse name: N/A  . Number of children: N/A  . Years of education: N/A   Occupational History  . Not on file.   Social History Main Topics  . Smoking status: Never Smoker  . Smokeless tobacco: Never Used  . Alcohol use 0.0 oz/week  . Drug use: No  . Sexual activity: Not Currently   Other Topics Concern  . Not on file   Social History Narrative  . No narrative on file   Health Maintenance  Topic Date Due  . HIV Screening  06/02/1973  . MAMMOGRAM  06/21/2015  . INFLUENZA VACCINE  11/26/2017 (Originally 04/29/2016)  . PAP SMEAR  12/05/2019 (Originally 06/03/1979)  . COLONOSCOPY  11/09/2023  . TETANUS/TDAP  11/26/2026  . Hepatitis C Screening  Completed    The following portions of the patient's history were reviewed and updated as appropriate: allergies, current medications, past family history, past medical history, past social history, past surgical history and problem list.  Review of Systems Pertinent items noted in HPI and remainder of comprehensive ROS otherwise negative.   Objective:    BP (!) 145/83   Pulse 72   Ht 5\' 3"  (1.6 m)   Wt 231 lb (104.8 kg)   BMI 40.92 kg/m  General appearance: alert, cooperative, appears stated age and moderately obese Head: Normocephalic, without obvious abnormality, atraumatic Eyes: conjunctivae/corneas clear. PERRL, EOM's intact. Fundi benign. Ears: normal TM's and external ear canals both ears Nose: Nares normal. Septum midline. Mucosa normal. No  drainage or sinus tenderness. Throat: lips, mucosa, and tongue normal; teeth and gums normal Neck: no adenopathy, no carotid bruit, no JVD, supple, symmetrical, trachea midline and thyroid not enlarged, symmetric, no tenderness/mass/nodules Back: symmetric, no curvature. ROM normal. No CVA tenderness. Lungs: clear to auscultation bilaterally Heart: regular rate and rhythm, S1, S2 normal, no murmur, click, rub or gallop Abdomen: soft, non-tender; bowel sounds normal; no masses,  no organomegaly Extremities: extremities normal, atraumatic, no cyanosis or edema Pulses: 2+ and symmetric Skin: Skin color, texture, turgor normal. No rashes or lesions Lymph nodes: Cervical, supraclavicular, and axillary nodes normal. Neurologic: Alert and oriented X 3, normal strength and tone. Normal symmetric reflexes. Normal coordination and gait    Assessment:    Healthy female exam.      Plan:  Marland KitchenMarland KitchenElianah was seen today for annual exam.  Diagnoses and all orders for this visit:  Preventative health care -     Lipid panel -     COMPLETE METABOLIC PANEL WITH GFR -     Hepatitis C Antibody -     TSH  Visit for screening mammogram -     MM DIGITAL SCREENING BILATERAL; Future -     MM DIGITAL SCREENING BILATERAL  Essential hypertension, benign -     lisinopril (PRINIVIL,ZESTRIL) 40 MG tablet; Take 1 tablet (40 mg total) by mouth daily. -  hydrochlorothiazide (HYDRODIURIL) 25 MG tablet; Take 1 tablet (25 mg total) by mouth daily.  Pure hypercholesterolemia  Bacterial vaginosis -     WET PREP FOR TRICH, YEAST, CLUE  Morbid obesity (HCC) -     TSH  Need for hepatitis C screening test -     Hepatitis C Antibody  Screening for lipid disorders -     Lipid panel  Screening for diabetes mellitus -     COMPLETE METABOLIC PANEL WITH GFR  Need for Tdap vaccination -     Tdap vaccine greater than or equal to 7yo IM  Anxiety -     ALPRAZolam (XANAX) 0.5 MG tablet; Take 1 tablet (0.5 mg total) by  mouth 3 (three) times daily as needed for anxiety.   BP too elevated start BP medication increase in 2 weeks if BP normal can start phentermine. Discussed side effects.  Discussed 1500 calorie diet and regular exercise.    See After Visit Summary for Counseling Recommendations

## 2016-11-26 NOTE — Telephone Encounter (Signed)
Patient advised. She will go to the pharmacy later. Have you faxed prescription?

## 2016-11-26 NOTE — Telephone Encounter (Signed)
Ok will print and fax.

## 2016-11-26 NOTE — Telephone Encounter (Signed)
Patient states the pharmacy did not receive a refill for xanax. Please advise. Historical provider.

## 2016-11-26 NOTE — Telephone Encounter (Signed)
Rx has been faxed.

## 2016-11-26 NOTE — Patient Instructions (Signed)
Keeping You Healthy  Get These Tests  Blood Pressure- Have your blood pressure checked by your healthcare provider at least once a year.  Normal blood pressure is 120/80.  Weight- Have your body mass index (BMI) calculated to screen for obesity.  BMI is a measure of body fat based on height and weight.  You can calculate your own BMI at www.nhlbisupport.com/bmi/  Cholesterol- Have your cholesterol checked every year.  Diabetes- Have your blood sugar checked every year if you have high blood pressure, high cholesterol, a family history of diabetes or if you are overweight.  Pap Test - Have a pap test every 1 to 5 years if you have been sexually active.  If you are older than 65 and recent pap tests have been normal you may not need additional pap tests.  In addition, if you have had a hysterectomy  for benign disease additional pap tests are not necessary.  Mammogram-Yearly mammograms are essential for early detection of breast cancer  Screening for Colon Cancer- Colonoscopy starting at age 50. Screening may begin sooner depending on your family history and other health conditions.  Follow up colonoscopy as directed by your Gastroenterologist.  Screening for Osteoporosis- Screening begins at age 65 with bone density scanning, sooner if you are at higher risk for developing Osteoporosis.  Get these medicines  Calcium with Vitamin D- Your body requires 1200-1500 mg of Calcium a day and 800-1000 IU of Vitamin D a day.  You can only absorb 500 mg of Calcium at a time therefore Calcium must be taken in 2 or 3 separate doses throughout the day.  Hormones- Hormone therapy has been associated with increased risk for certain cancers and heart disease.  Talk to your healthcare provider about if you need relief from menopausal symptoms.  Aspirin- Ask your healthcare provider about taking Aspirin to prevent Heart Disease and Stroke.  Get these Immuniztions  Flu shot- Every fall  Pneumonia shot-  Once after the age of 59; if you are younger ask your healthcare provider if you need a pneumonia shot.  Tetanus- Every ten years.  Zostavax- Once after the age of 59 to prevent shingles.  Take these steps  Don't smoke- Your healthcare provider can help you quit. For tips on how to quit, ask your healthcare provider or go to www.smokefree.gov or call 1-800 QUIT-NOW.  Be physically active- Exercise 5 days a week for a minimum of 30 minutes.  If you are not already physically active, start slow and gradually work up to 30 minutes of moderate physical activity.  Try walking, dancing, bike riding, swimming, etc.  Eat a healthy diet- Eat a variety of healthy foods such as fruits, vegetables, whole grains, low fat milk, low fat cheeses, yogurt, lean meats, chicken, fish, eggs, dried beans, tofu, etc.  For more information go to www.thenutritionsource.org  Dental visit- Brush and floss teeth twice daily; visit your dentist twice a year.  Eye exam- Visit your Optometrist or Ophthalmologist yearly.  Drink alcohol in moderation- Limit alcohol intake to one drink or less a day.  Never drink and drive.  Depression- Your emotional health is as important as your physical health.  If you're feeling down or losing interest in things you normally enjoy, please talk to your healthcare provider.  Seat Belts- can save your life; always wear one  Smoke/Carbon Monoxide detectors- These detectors need to be installed on the appropriate level of your home.  Replace batteries at least once a year.  Violence- If   anyone is threatening or hurting you, please tell your healthcare provider.  Living Will/ Health care power of attorney- Discuss with your healthcare provider and family. 

## 2016-11-26 NOTE — Progress Notes (Signed)
Call pt: yeast, trich, BV negative. I would start testing vaginal pH and use boric acid suppositories.

## 2016-11-27 LAB — HEPATITIS C ANTIBODY: HCV AB: NEGATIVE

## 2016-11-30 DIAGNOSIS — R921 Mammographic calcification found on diagnostic imaging of breast: Secondary | ICD-10-CM | POA: Insufficient documentation

## 2016-12-01 NOTE — Progress Notes (Signed)
Call pt: negative hep C screening.  Cholesterol looks better than 2 years ago. Way to go.  Kidney, liver, glucose look great.  Thyroid is great.

## 2016-12-10 ENCOUNTER — Ambulatory Visit (INDEPENDENT_AMBULATORY_CARE_PROVIDER_SITE_OTHER): Payer: 59 | Admitting: Physician Assistant

## 2016-12-10 VITALS — BP 130/69 | HR 70

## 2016-12-10 DIAGNOSIS — I10 Essential (primary) hypertension: Secondary | ICD-10-CM | POA: Diagnosis not present

## 2016-12-10 NOTE — Progress Notes (Signed)
Patient came into clinic today for BP recheck. Pt reports at last OV her lisinopril Rx was increased to 40mg  daily. Pt states she has been checking her BP at work and it has been "good." Denies any headaches, dizziness, blurred vision, palpations, chest pain. Pt's BP in office today was 130/69. Advised I would route this to her PCP and call if there were going to be any changes. Verbalized understanding. No further questions.   Blood pressure looks great today. Encourage patient to get BP cuff and start checking on her own. Did she take lisinopril today? Tandy GawJade Breeback PA-C

## 2016-12-11 ENCOUNTER — Other Ambulatory Visit: Payer: Self-pay

## 2016-12-11 DIAGNOSIS — I10 Essential (primary) hypertension: Secondary | ICD-10-CM

## 2016-12-11 MED ORDER — HYDROCHLOROTHIAZIDE 25 MG PO TABS
25.0000 mg | ORAL_TABLET | Freq: Every day | ORAL | 0 refills | Status: AC
Start: 2016-12-11 — End: ?

## 2016-12-11 MED ORDER — PHENTERMINE HCL 37.5 MG PO TABS: ORAL_TABLET | ORAL | 0 refills | Status: DC

## 2016-12-11 MED ORDER — LISINOPRIL 40 MG PO TABS
40.0000 mg | ORAL_TABLET | Freq: Every day | ORAL | 0 refills | Status: AC
Start: 2016-12-11 — End: ?

## 2016-12-11 NOTE — Progress Notes (Signed)
Pt advised. Questions now that BP is controlled if PCP will start her on Phentermine. Routing for review.

## 2016-12-11 NOTE — Addendum Note (Signed)
Addended by: Collie SiadICHARDSON, Demonica Farrey M on: 12/11/2016 11:52 AM   Modules accepted: Orders

## 2016-12-11 NOTE — Progress Notes (Signed)
Ok for phentermine 37.5mg  start with 1/2 tablet for first 2 weeks as this has a side effect of dizziness as well. Follow up in 1 month nurse visit. Make sure to work on losing at least 4lbs.

## 2016-12-11 NOTE — Progress Notes (Signed)
Rx written. Pt advised.

## 2016-12-11 NOTE — Progress Notes (Signed)
Just keep BP log and check when she feels dizzy especially.

## 2016-12-11 NOTE — Progress Notes (Signed)
Yes, Pt did take lisinopril. Questions if she needs a home cuff since she can get her BP checked at work- she works at a clinic.

## 2017-03-26 ENCOUNTER — Ambulatory Visit: Payer: 59 | Admitting: Physician Assistant

## 2017-03-27 ENCOUNTER — Other Ambulatory Visit: Payer: Self-pay | Admitting: Physician Assistant

## 2017-03-27 DIAGNOSIS — F419 Anxiety disorder, unspecified: Secondary | ICD-10-CM

## 2017-08-13 ENCOUNTER — Other Ambulatory Visit: Payer: Self-pay | Admitting: Family Medicine

## 2017-08-13 ENCOUNTER — Ambulatory Visit
Admission: RE | Admit: 2017-08-13 | Discharge: 2017-08-13 | Disposition: A | Discharge status: Home / Self Care | Payer: 59 | Source: Ambulatory Visit | Attending: Family Medicine | Admitting: Family Medicine

## 2017-08-13 DIAGNOSIS — R52 Pain, unspecified: Secondary | ICD-10-CM

## 2018-01-08 DIAGNOSIS — M1811 Unilateral primary osteoarthritis of first carpometacarpal joint, right hand: Secondary | ICD-10-CM | POA: Insufficient documentation

## 2018-03-04 ENCOUNTER — Other Ambulatory Visit: Payer: Self-pay | Admitting: *Deleted

## 2018-03-04 ENCOUNTER — Other Ambulatory Visit: Payer: Self-pay | Admitting: Family Medicine

## 2018-03-04 DIAGNOSIS — R921 Mammographic calcification found on diagnostic imaging of breast: Secondary | ICD-10-CM

## 2018-03-04 DIAGNOSIS — Z1239 Encounter for other screening for malignant neoplasm of breast: Secondary | ICD-10-CM

## 2018-03-05 ENCOUNTER — Ambulatory Visit
Admission: RE | Admit: 2018-03-05 | Discharge: 2018-03-05 | Disposition: A | Discharge status: Home / Self Care | Payer: 59 | Source: Ambulatory Visit | Attending: Family Medicine | Admitting: Family Medicine

## 2018-03-05 ENCOUNTER — Ambulatory Visit: Payer: 59

## 2018-03-05 DIAGNOSIS — R921 Mammographic calcification found on diagnostic imaging of breast: Secondary | ICD-10-CM

## 2018-06-01 DIAGNOSIS — E7800 Pure hypercholesterolemia, unspecified: Secondary | ICD-10-CM | POA: Insufficient documentation

## 2018-07-20 DIAGNOSIS — M7711 Lateral epicondylitis, right elbow: Secondary | ICD-10-CM | POA: Insufficient documentation

## 2018-07-20 DIAGNOSIS — G5601 Carpal tunnel syndrome, right upper limb: Secondary | ICD-10-CM | POA: Insufficient documentation

## 2018-07-30 HISTORY — PX: JOINT REPLACEMENT: SHX530

## 2018-08-12 ENCOUNTER — Other Ambulatory Visit (HOSPITAL_COMMUNITY): Payer: Self-pay | Admitting: *Deleted

## 2018-08-12 NOTE — Patient Instructions (Addendum)
Dawn JewelsDebra G Tapia  08/12/2018   Your procedure is scheduled on: 08-23-18  [facility]  Report to admitting at 800 AM    Call this number if you have problems the morning of surgery [phone]   Remember: Do not eat food or drink liquids :After Midnight. BRUSH YOUR TEETH MORNING OF SURGERY AND RINSE YOUR MOUTH OUT, NO CHEWING GUM CANDY OR MINTS.     Take these medicines the morning of surgery with A SIP OF WATER: ALPRAZOLAM (XANAX) IF NEEDED, ES TYLENOL IF NEEDED                               You may not have any metal on your body including hair pins and              piercings  Do not wear jewelry, make-up, lotions, powders or perfumes, deodorant             Do not wear nail polish.  Do not shave  48 hours prior to surgery.             Do not bring valuables to the hospital. [facility] IS NOT             RESPONSIBLE   FOR VALUABLES.  Contacts, dentures or bridgework may not be worn into surgery.  Leave suitcase in the car. After surgery it may be brought to your room.                   Please read over the following fact sheets you were given: _____________________________________________________________________             [facility] Pre[facility] Before surgery, you can play an important role.  Because skin is not sterile, your skin needs to be as free of germs as possible.  You can reduce the number of germs on your skin by washing with CHG (chlorahexidine gluconate) soap before surgery.  CHG is an antiseptic cleaner which kills germs and bonds with the skin to continue killing germs even after washing. Please DO NOT use if you have an allergy to CHG or antibacterial soaps.  If your skin becomes reddened/irritated stop using the CHG and inform your nurse when you arrive at Short Stay. Do not shave (including legs and underarms) for at least 48 hours prior to the first CHG shower.  You may shave your face/neck. Please  follow these instructions carefully:  1.  Shower with CHG Soap the night before surgery and the  morning of Surgery.  2.  If you choose to wash your hair, wash your hair first as usual with your  normal  shampoo.  3.  After you shampoo, rinse your hair and body thoroughly to remove the  shampoo.                           4.  Use CHG as you would any other liquid soap.  You can apply chg directly  to the skin and wash                       Gently with a scrungie or clean washcloth.  5.  Apply the CHG Soap to your body ONLY FROM THE NECK DOWN.  Do not use on face/ open                           Wound or open sores. Avoid contact with eyes, ears mouth and genitals (private parts).                       Wash face,  Genitals (private parts) with your normal soap.             6.  Wash thoroughly, paying special attention to the area where your surgery  will be performed.  7.  Thoroughly rinse your body with warm water from the neck down.  8.  DO NOT shower/wash with your normal soap after using and rinsing off  the CHG Soap.                9.  Pat yourself dry with a clean towel.            10.  Wear clean pajamas.            11.  Place clean sheets on your bed the night of your first shower and do not  sleep with pets. Day of Surgery : Do not apply any lotions/deodorants the morning of surgery.  Please wear clean clothes to the hospital/surgery center.  FAILURE TO FOLLOW THESE INSTRUCTIONS MAY RESULT IN THE CANCELLATION OF YOUR SURGERY PATIENT SIGNATURE_________________________________  NURSE SIGNATURE__________________________________  ________________________________________________________________________

## 2018-08-18 ENCOUNTER — Other Ambulatory Visit: Payer: Self-pay | Admitting: Orthopedic Surgery

## 2018-08-19 ENCOUNTER — Encounter (HOSPITAL_COMMUNITY): Payer: Self-pay

## 2018-08-19 ENCOUNTER — Other Ambulatory Visit: Payer: Self-pay

## 2018-08-19 ENCOUNTER — Encounter (HOSPITAL_COMMUNITY)
Admission: RE | Admit: 2018-08-19 | Discharge: 2018-08-19 | Disposition: A | Discharge status: Home / Self Care | Payer: 59 | Source: Ambulatory Visit | Attending: Orthopedic Surgery | Admitting: Orthopedic Surgery

## 2018-08-19 DIAGNOSIS — I10 Essential (primary) hypertension: Secondary | ICD-10-CM | POA: Insufficient documentation

## 2018-08-19 DIAGNOSIS — Z01818 Encounter for other preprocedural examination: Secondary | ICD-10-CM | POA: Insufficient documentation

## 2018-08-19 HISTORY — DX: Headache, unspecified: R51.9

## 2018-08-19 HISTORY — DX: Gastro-esophageal reflux disease without esophagitis: K21.9

## 2018-08-19 HISTORY — DX: Unspecified osteoarthritis, unspecified site: M19.90

## 2018-08-19 HISTORY — DX: Headache: R51

## 2018-08-19 HISTORY — DX: Anxiety disorder, unspecified: F41.9

## 2018-08-19 HISTORY — DX: Sleep apnea, unspecified: G47.30

## 2018-08-19 LAB — CBC WITH DIFFERENTIAL/PLATELET
Abs Immature Granulocytes: 0.07 10*3/uL (ref 0.00–0.07)
BASOS ABS: 0.1 10*3/uL (ref 0.0–0.1)
Basophils Relative: 1 %
EOS PCT: 4 %
Eosinophils Absolute: 0.4 10*3/uL (ref 0.0–0.5)
HCT: 42.3 % (ref 36.0–46.0)
HEMOGLOBIN: 13.5 g/dL (ref 12.0–15.0)
Immature Granulocytes: 1 %
LYMPHS PCT: 24 %
Lymphs Abs: 2.5 10*3/uL (ref 0.7–4.0)
MCH: 29.7 pg (ref 26.0–34.0)
MCHC: 31.9 g/dL (ref 30.0–36.0)
MCV: 93.2 fL (ref 80.0–100.0)
Monocytes Absolute: 1 10*3/uL (ref 0.1–1.0)
Monocytes Relative: 9 %
NRBC: 0 % (ref 0.0–0.2)
Neutro Abs: 6.4 10*3/uL (ref 1.7–7.7)
Neutrophils Relative %: 61 %
Platelets: 247 10*3/uL (ref 150–400)
RBC: 4.54 MIL/uL (ref 3.87–5.11)
RDW: 14 % (ref 11.5–15.5)
WBC: 10.5 10*3/uL (ref 4.0–10.5)

## 2018-08-19 LAB — COMPREHENSIVE METABOLIC PANEL
ALBUMIN: 4 g/dL (ref 3.5–5.0)
ALT: 15 U/L (ref 0–44)
ANION GAP: 8 (ref 5–15)
AST: 17 U/L (ref 15–41)
Alkaline Phosphatase: 69 U/L (ref 38–126)
BUN: 22 mg/dL — ABNORMAL HIGH (ref 6–20)
CHLORIDE: 107 mmol/L (ref 98–111)
CO2: 30 mmol/L (ref 22–32)
Calcium: 9.3 mg/dL (ref 8.9–10.3)
Creatinine, Ser: 0.7 mg/dL (ref 0.44–1.00)
GFR calc Af Amer: >60 mL/min (ref >60)
GFR calc non Af Amer: >60 mL/min (ref >60)
GLUCOSE: 101 mg/dL — AB (ref 70–99)
POTASSIUM: 3.6 mmol/L (ref 3.5–5.1)
SODIUM: 145 mmol/L (ref 135–145)
Total Bilirubin: 0.3 mg/dL (ref 0.3–1.2)
Total Protein: 6.9 g/dL (ref 6.5–8.1)

## 2018-08-19 LAB — SURGICAL PCR SCREEN
MRSA, PCR: NEGATIVE
Staphylococcus aureus: NEGATIVE

## 2018-08-22 MED ORDER — BUPIVACAINE LIPOSOME 1.3 % IJ SUSP
20.0000 mL | INTRAMUSCULAR | Status: DC
  Filled 2018-08-22: qty 20

## 2018-08-23 ENCOUNTER — Ambulatory Visit (HOSPITAL_COMMUNITY): Payer: 59 | Admitting: Anesthesiology

## 2018-08-23 ENCOUNTER — Other Ambulatory Visit: Payer: Self-pay

## 2018-08-23 ENCOUNTER — Observation Stay (HOSPITAL_COMMUNITY)
Admission: RE | Admit: 2018-08-23 | Discharge: 2018-08-25 | Disposition: A | Discharge status: Home / Self Care | Payer: 59 | Source: Ambulatory Visit | Attending: Orthopedic Surgery | Admitting: Orthopedic Surgery

## 2018-08-23 ENCOUNTER — Encounter (HOSPITAL_COMMUNITY)
Admission: RE | Disposition: A | Discharge status: Home / Self Care | Payer: Self-pay | Source: Ambulatory Visit | Attending: Orthopedic Surgery

## 2018-08-23 ENCOUNTER — Encounter (HOSPITAL_COMMUNITY): Payer: Self-pay | Admitting: *Deleted

## 2018-08-23 DIAGNOSIS — Z96659 Presence of unspecified artificial knee joint: Secondary | ICD-10-CM

## 2018-08-23 DIAGNOSIS — Z87891 Personal history of nicotine dependence: Secondary | ICD-10-CM | POA: Insufficient documentation

## 2018-08-23 DIAGNOSIS — Z79899 Other long term (current) drug therapy: Secondary | ICD-10-CM | POA: Diagnosis not present

## 2018-08-23 DIAGNOSIS — M1711 Unilateral primary osteoarthritis, right knee: Secondary | ICD-10-CM | POA: Diagnosis not present

## 2018-08-23 DIAGNOSIS — F419 Anxiety disorder, unspecified: Secondary | ICD-10-CM | POA: Insufficient documentation

## 2018-08-23 DIAGNOSIS — I10 Essential (primary) hypertension: Secondary | ICD-10-CM | POA: Insufficient documentation

## 2018-08-23 HISTORY — PX: TOTAL KNEE ARTHROPLASTY: SHX125

## 2018-08-23 SURGERY — ARTHROPLASTY, KNEE, TOTAL
Anesthesia: Spinal | Site: Knee | Laterality: Right

## 2018-08-23 MED ORDER — PHENYLEPHRINE 40 MCG/ML (10ML) SYRINGE FOR IV PUSH (FOR BLOOD PRESSURE SUPPORT)
PREFILLED_SYRINGE | INTRAVENOUS | Status: AC
  Filled 2018-08-23: qty 10

## 2018-08-23 MED ORDER — FENTANYL CITRATE (PF) 250 MCG/5ML IJ SOLN
INTRAMUSCULAR | Status: DC | PRN
  Administered 2018-08-23: 50 ug via INTRAVENOUS
  Administered 2018-08-23: 25 ug via INTRAVENOUS
  Administered 2018-08-23 (×3): 50 ug via INTRAVENOUS
  Administered 2018-08-23: 25 ug via INTRAVENOUS

## 2018-08-23 MED ORDER — LISINOPRIL 20 MG PO TABS
40.0000 mg | ORAL_TABLET | Freq: Every day | ORAL | Status: DC
  Administered 2018-08-23 – 2018-08-25 (×3): 40 mg via ORAL
  Filled 2018-08-23 (×3): qty 2

## 2018-08-23 MED ORDER — MIDAZOLAM HCL 2 MG/2ML IJ SOLN
1.0000 mg | Freq: Once | INTRAMUSCULAR | Status: AC
  Administered 2018-08-23: 1 mg via INTRAVENOUS
  Filled 2018-08-23: qty 2

## 2018-08-23 MED ORDER — LIP MEDEX EX OINT
TOPICAL_OINTMENT | CUTANEOUS | Status: AC
  Filled 2018-08-23: qty 7

## 2018-08-23 MED ORDER — ONDANSETRON HCL 4 MG/2ML IJ SOLN: 4.0000 mg | Freq: Once | INTRAMUSCULAR | Status: DC | PRN

## 2018-08-23 MED ORDER — FENTANYL CITRATE (PF) 100 MCG/2ML IJ SOLN
50.0000 ug | Freq: Once | INTRAMUSCULAR | Status: AC
  Administered 2018-08-23: 50 ug via INTRAVENOUS
  Filled 2018-08-23: qty 2

## 2018-08-23 MED ORDER — EPHEDRINE 5 MG/ML INJ
INTRAVENOUS | Status: AC
  Filled 2018-08-23: qty 10

## 2018-08-23 MED ORDER — GABAPENTIN 300 MG PO CAPS
300.0000 mg | ORAL_CAPSULE | Freq: Once | ORAL | Status: AC
  Administered 2018-08-23: 300 mg via ORAL
  Filled 2018-08-23: qty 1

## 2018-08-23 MED ORDER — HYDROMORPHONE HCL 1 MG/ML IJ SOLN
0.2500 mg | INTRAMUSCULAR | Status: DC | PRN
  Administered 2018-08-23: 0.5 mg via INTRAVENOUS

## 2018-08-23 MED ORDER — SODIUM CHLORIDE 0.9 % IR SOLN
Status: DC | PRN
  Administered 2018-08-23: 2000 mL
  Administered 2018-08-23: 1000 mL

## 2018-08-23 MED ORDER — CHLORHEXIDINE GLUCONATE 4 % EX LIQD: 60.0000 mL | Freq: Once | CUTANEOUS | Status: DC

## 2018-08-23 MED ORDER — ACETAMINOPHEN 500 MG PO TABS
1000.0000 mg | ORAL_TABLET | Freq: Once | ORAL | Status: AC
  Administered 2018-08-23: 1000 mg via ORAL
  Filled 2018-08-23: qty 2

## 2018-08-23 MED ORDER — MENTHOL 3 MG MT LOZG: 1.0000 | LOZENGE | OROMUCOSAL | Status: DC | PRN

## 2018-08-23 MED ORDER — ALPRAZOLAM 0.5 MG PO TABS
0.5000 mg | ORAL_TABLET | Freq: Three times a day (TID) | ORAL | Status: DC | PRN
  Administered 2018-08-23 – 2018-08-24 (×2): 0.5 mg via ORAL
  Filled 2018-08-23 (×3): qty 1

## 2018-08-23 MED ORDER — ZOLPIDEM TARTRATE 5 MG PO TABS: 5.0000 mg | ORAL_TABLET | Freq: Every evening | ORAL | Status: DC | PRN

## 2018-08-23 MED ORDER — ASPIRIN EC 325 MG PO TBEC
325.0000 mg | DELAYED_RELEASE_TABLET | Freq: Two times a day (BID) | ORAL | Status: DC
  Administered 2018-08-24 – 2018-08-25 (×3): 325 mg via ORAL
  Filled 2018-08-23 (×3): qty 1

## 2018-08-23 MED ORDER — PROPOFOL 10 MG/ML IV BOLUS
INTRAVENOUS | Status: AC
  Filled 2018-08-23: qty 40

## 2018-08-23 MED ORDER — TRANEXAMIC ACID-NACL 1000-0.7 MG/100ML-% IV SOLN
1000.0000 mg | Freq: Once | INTRAVENOUS | Status: AC
  Administered 2018-08-23: 1000 mg via INTRAVENOUS
  Filled 2018-08-23: qty 100

## 2018-08-23 MED ORDER — METHOCARBAMOL 500 MG IVPB - SIMPLE MED
INTRAVENOUS | Status: AC
  Filled 2018-08-23: qty 50

## 2018-08-23 MED ORDER — METOCLOPRAMIDE HCL 5 MG/ML IJ SOLN: 5.0000 mg | Freq: Three times a day (TID) | INTRAMUSCULAR | Status: DC | PRN

## 2018-08-23 MED ORDER — PROPOFOL 500 MG/50ML IV EMUL
INTRAVENOUS | Status: DC | PRN
  Administered 2018-08-23: 50 ug/kg/min via INTRAVENOUS

## 2018-08-23 MED ORDER — METHOCARBAMOL 500 MG IVPB - SIMPLE MED
500.0000 mg | Freq: Four times a day (QID) | INTRAVENOUS | Status: DC | PRN
  Administered 2018-08-23: 500 mg via INTRAVENOUS
  Filled 2018-08-23: qty 50

## 2018-08-23 MED ORDER — OXYCODONE HCL 5 MG PO TABS
5.0000 mg | ORAL_TABLET | ORAL | Status: DC | PRN
  Administered 2018-08-23 – 2018-08-24 (×4): 10 mg via ORAL
  Administered 2018-08-24: 5 mg via ORAL
  Administered 2018-08-25 (×2): 10 mg via ORAL
  Filled 2018-08-23 (×4): qty 2
  Filled 2018-08-23: qty 1
  Filled 2018-08-23 (×2): qty 2

## 2018-08-23 MED ORDER — METHOCARBAMOL 500 MG PO TABS
500.0000 mg | ORAL_TABLET | Freq: Four times a day (QID) | ORAL | Status: DC | PRN
  Administered 2018-08-24 – 2018-08-25 (×3): 500 mg via ORAL
  Filled 2018-08-23 (×3): qty 1

## 2018-08-23 MED ORDER — DIPHENHYDRAMINE HCL 12.5 MG/5ML PO ELIX: 12.5000 mg | ORAL_SOLUTION | ORAL | Status: DC | PRN

## 2018-08-23 MED ORDER — DOCUSATE SODIUM 100 MG PO CAPS
100.0000 mg | ORAL_CAPSULE | Freq: Two times a day (BID) | ORAL | Status: DC
  Administered 2018-08-23 – 2018-08-25 (×4): 100 mg via ORAL
  Filled 2018-08-23 (×4): qty 1

## 2018-08-23 MED ORDER — PHENYLEPHRINE 40 MCG/ML (10ML) SYRINGE FOR IV PUSH (FOR BLOOD PRESSURE SUPPORT)
PREFILLED_SYRINGE | INTRAVENOUS | Status: DC | PRN
  Administered 2018-08-23: 80 ug via INTRAVENOUS

## 2018-08-23 MED ORDER — METOCLOPRAMIDE HCL 5 MG PO TABS: 5.0000 mg | ORAL_TABLET | Freq: Three times a day (TID) | ORAL | Status: DC | PRN

## 2018-08-23 MED ORDER — LACTATED RINGERS IV SOLN
INTRAVENOUS | Status: DC
  Administered 2018-08-23 (×2): via INTRAVENOUS

## 2018-08-23 MED ORDER — SODIUM CHLORIDE 0.9 % IV SOLN
INTRAVENOUS | Status: DC
  Administered 2018-08-23 – 2018-08-24 (×2): via INTRAVENOUS

## 2018-08-23 MED ORDER — HYDROMORPHONE HCL 1 MG/ML IJ SOLN
INTRAMUSCULAR | Status: AC
  Filled 2018-08-23: qty 1

## 2018-08-23 MED ORDER — FENTANYL CITRATE (PF) 100 MCG/2ML IJ SOLN
INTRAMUSCULAR | Status: AC
  Filled 2018-08-23: qty 2

## 2018-08-23 MED ORDER — DEXAMETHASONE SODIUM PHOSPHATE 10 MG/ML IJ SOLN
INTRAMUSCULAR | Status: AC
  Filled 2018-08-23: qty 1

## 2018-08-23 MED ORDER — BUPIVACAINE LIPOSOME 1.3 % IJ SUSP
INTRAMUSCULAR | Status: DC | PRN
  Administered 2018-08-23: 20 mL

## 2018-08-23 MED ORDER — TRAMADOL HCL 50 MG PO TABS
50.0000 mg | ORAL_TABLET | Freq: Four times a day (QID) | ORAL | Status: DC
  Administered 2018-08-23 – 2018-08-25 (×7): 50 mg via ORAL
  Filled 2018-08-23 (×8): qty 1

## 2018-08-23 MED ORDER — PROMETHAZINE HCL 25 MG/ML IJ SOLN: 12.5000 mg | Freq: Once | INTRAMUSCULAR | Status: DC | PRN

## 2018-08-23 MED ORDER — CEFAZOLIN SODIUM-DEXTROSE 2-4 GM/100ML-% IV SOLN
2.0000 g | INTRAVENOUS | Status: AC
  Administered 2018-08-23: 2 g via INTRAVENOUS
  Filled 2018-08-23: qty 100

## 2018-08-23 MED ORDER — MIDAZOLAM HCL 2 MG/2ML IJ SOLN
INTRAMUSCULAR | Status: AC
  Filled 2018-08-23: qty 2

## 2018-08-23 MED ORDER — LIDOCAINE 2% (20 MG/ML) 5 ML SYRINGE
INTRAMUSCULAR | Status: AC
  Filled 2018-08-23: qty 5

## 2018-08-23 MED ORDER — FENTANYL CITRATE (PF) 100 MCG/2ML IJ SOLN
25.0000 ug | INTRAMUSCULAR | Status: DC | PRN
  Administered 2018-08-23 (×3): 50 ug via INTRAVENOUS

## 2018-08-23 MED ORDER — ONDANSETRON HCL 4 MG/2ML IJ SOLN
INTRAMUSCULAR | Status: AC
  Filled 2018-08-23: qty 2

## 2018-08-23 MED ORDER — SODIUM CHLORIDE (PF) 0.9 % IJ SOLN
INTRAMUSCULAR | Status: AC
  Filled 2018-08-23: qty 20

## 2018-08-23 MED ORDER — ONDANSETRON HCL 4 MG/2ML IJ SOLN
4.0000 mg | Freq: Four times a day (QID) | INTRAMUSCULAR | Status: DC | PRN
  Administered 2018-08-23 – 2018-08-24 (×2): 4 mg via INTRAVENOUS
  Filled 2018-08-23 (×2): qty 2

## 2018-08-23 MED ORDER — GABAPENTIN 300 MG PO CAPS
300.0000 mg | ORAL_CAPSULE | Freq: Three times a day (TID) | ORAL | Status: DC
  Administered 2018-08-23 – 2018-08-25 (×5): 300 mg via ORAL
  Filled 2018-08-23 (×5): qty 1

## 2018-08-23 MED ORDER — ONDANSETRON HCL 4 MG/2ML IJ SOLN
INTRAMUSCULAR | Status: DC | PRN
  Administered 2018-08-23: 4 mg via INTRAVENOUS

## 2018-08-23 MED ORDER — DIPHENHYDRAMINE HCL 50 MG/ML IJ SOLN
INTRAMUSCULAR | Status: AC
  Filled 2018-08-23: qty 1

## 2018-08-23 MED ORDER — BISACODYL 5 MG PO TBEC: 5.0000 mg | DELAYED_RELEASE_TABLET | Freq: Every day | ORAL | Status: DC | PRN

## 2018-08-23 MED ORDER — ACETAMINOPHEN 500 MG PO TABS
1000.0000 mg | ORAL_TABLET | Freq: Four times a day (QID) | ORAL | Status: AC
  Administered 2018-08-23 – 2018-08-24 (×4): 1000 mg via ORAL
  Filled 2018-08-23 (×4): qty 2

## 2018-08-23 MED ORDER — FERROUS SULFATE 325 (65 FE) MG PO TABS
325.0000 mg | ORAL_TABLET | Freq: Three times a day (TID) | ORAL | Status: DC
  Administered 2018-08-24 – 2018-08-25 (×3): 325 mg via ORAL
  Filled 2018-08-23 (×4): qty 1

## 2018-08-23 MED ORDER — HYDROCHLOROTHIAZIDE 25 MG PO TABS
25.0000 mg | ORAL_TABLET | Freq: Every day | ORAL | Status: DC
  Administered 2018-08-23 – 2018-08-24 (×2): 25 mg via ORAL
  Filled 2018-08-23 (×3): qty 1

## 2018-08-23 MED ORDER — CEFAZOLIN SODIUM-DEXTROSE 2-4 GM/100ML-% IV SOLN
2.0000 g | Freq: Four times a day (QID) | INTRAVENOUS | Status: AC
  Administered 2018-08-23 (×2): 2 g via INTRAVENOUS
  Filled 2018-08-23 (×2): qty 100

## 2018-08-23 MED ORDER — TRANEXAMIC ACID-NACL 1000-0.7 MG/100ML-% IV SOLN
1000.0000 mg | INTRAVENOUS | Status: AC
  Administered 2018-08-23: 1000 mg via INTRAVENOUS
  Filled 2018-08-23: qty 100

## 2018-08-23 MED ORDER — ALUM & MAG HYDROXIDE-SIMETH 200-200-20 MG/5ML PO SUSP: 30.0000 mL | ORAL | Status: DC | PRN

## 2018-08-23 MED ORDER — ONDANSETRON HCL 4 MG PO TABS: 4.0000 mg | ORAL_TABLET | Freq: Four times a day (QID) | ORAL | Status: DC | PRN

## 2018-08-23 MED ORDER — MIDAZOLAM HCL 2 MG/2ML IJ SOLN
2.0000 mg | Freq: Once | INTRAMUSCULAR | Status: AC
  Administered 2018-08-23: 0.5 mg via INTRAVENOUS

## 2018-08-23 MED ORDER — BUPIVACAINE-EPINEPHRINE (PF) 0.25% -1:200000 IJ SOLN
INTRAMUSCULAR | Status: AC
  Filled 2018-08-23: qty 30

## 2018-08-23 MED ORDER — LIDOCAINE 2% (20 MG/ML) 5 ML SYRINGE
INTRAMUSCULAR | Status: DC | PRN
  Administered 2018-08-23: 60 mg via INTRAVENOUS

## 2018-08-23 MED ORDER — MIDAZOLAM HCL 5 MG/5ML IJ SOLN
INTRAMUSCULAR | Status: DC | PRN
  Administered 2018-08-23 (×2): 1 mg via INTRAVENOUS

## 2018-08-23 MED ORDER — SODIUM CHLORIDE 0.9% FLUSH
INTRAVENOUS | Status: DC | PRN
  Administered 2018-08-23: 20 mL

## 2018-08-23 MED ORDER — PHENOL 1.4 % MT LIQD
1.0000 | OROMUCOSAL | Status: DC | PRN
  Filled 2018-08-23: qty 177

## 2018-08-23 MED ORDER — PANTOPRAZOLE SODIUM 40 MG PO TBEC
40.0000 mg | DELAYED_RELEASE_TABLET | Freq: Every day | ORAL | Status: DC
  Administered 2018-08-23 – 2018-08-25 (×3): 40 mg via ORAL
  Filled 2018-08-23 (×3): qty 1

## 2018-08-23 MED ORDER — DEXAMETHASONE SODIUM PHOSPHATE 10 MG/ML IJ SOLN
10.0000 mg | Freq: Once | INTRAMUSCULAR | Status: AC
  Administered 2018-08-24: 10 mg via INTRAVENOUS
  Filled 2018-08-23: qty 1

## 2018-08-23 MED ORDER — FENTANYL CITRATE (PF) 100 MCG/2ML IJ SOLN: 25.0000 ug | INTRAMUSCULAR | Status: DC | PRN

## 2018-08-23 MED ORDER — BUPIVACAINE-EPINEPHRINE 0.5% -1:200000 IJ SOLN
INTRAMUSCULAR | Status: DC | PRN
  Administered 2018-08-23: 30 mL

## 2018-08-23 MED ORDER — SENNOSIDES-DOCUSATE SODIUM 8.6-50 MG PO TABS: 1.0000 | ORAL_TABLET | Freq: Every evening | ORAL | Status: DC | PRN

## 2018-08-23 MED ORDER — DEXAMETHASONE SODIUM PHOSPHATE 10 MG/ML IJ SOLN: 8.0000 mg | Freq: Once | INTRAMUSCULAR | Status: DC

## 2018-08-23 MED ORDER — FLEET ENEMA 7-19 GM/118ML RE ENEM: 1.0000 | ENEMA | Freq: Once | RECTAL | Status: DC | PRN

## 2018-08-23 SURGICAL SUPPLY — 58 items
ARTISURF 10M PLY R 6-9CD KNEE (Knees) ×1 IMPLANT
BAG SPEC THK2 15X12 ZIP CLS (MISCELLANEOUS) ×1
BAG ZIPLOCK 12X15 (MISCELLANEOUS) ×2 IMPLANT
BANDAGE ACE 6X5 VEL STRL LF (GAUZE/BANDAGES/DRESSINGS) ×2 IMPLANT
BLADE SAGITTAL 13X1.27X60 (BLADE) ×2 IMPLANT
BLADE SAW SGTL 83.5X18.5 (BLADE) ×2 IMPLANT
BLADE SURG 15 STRL LF DISP TIS (BLADE) ×1 IMPLANT
BLADE SURG 15 STRL SS (BLADE) ×2
BNDG CMPR MED 10X6 ELC LF (GAUZE/BANDAGES/DRESSINGS) ×1
BNDG ELASTIC 6X10 VLCR STRL LF (GAUZE/BANDAGES/DRESSINGS) ×1 IMPLANT
BOWL SMART MIX CTS (DISPOSABLE) ×2 IMPLANT
BSPLAT TIB 5D C 16NT STM RT (Knees) ×1 IMPLANT
CEMENT BONE SIMPLEX SPEEDSET (Cement) ×4 IMPLANT
COMP FEM PERSONA SZ7 RT (Joint) ×2 IMPLANT
COMPONENT FEM PERSONA SZ7 RT (Joint) IMPLANT
COVER SURGICAL LIGHT HANDLE (MISCELLANEOUS) ×2 IMPLANT
COVER WAND RF STERILE (DRAPES) ×1 IMPLANT
CUFF TOURN SGL QUICK 34 (TOURNIQUET CUFF) ×2
CUFF TRNQT CYL 34X4X40X1 (TOURNIQUET CUFF) ×1 IMPLANT
DECANTER SPIKE VIAL GLASS SM (MISCELLANEOUS) ×4 IMPLANT
DRAPE INCISE IOBAN 66X45 STRL (DRAPES) ×4 IMPLANT
DRAPE U-SHAPE 47X51 STRL (DRAPES) ×2 IMPLANT
DRESSING AQUACEL AG SP 3.5X10 (GAUZE/BANDAGES/DRESSINGS) IMPLANT
DRSG AQUACEL AG ADV 3.5X10 (GAUZE/BANDAGES/DRESSINGS) ×2 IMPLANT
DRSG AQUACEL AG SP 3.5X10 (GAUZE/BANDAGES/DRESSINGS) ×2
DURAPREP 26ML APPLICATOR (WOUND CARE) ×4 IMPLANT
ELECT REM PT RETURN 15FT ADLT (MISCELLANEOUS) ×2 IMPLANT
GLOVE BIOGEL M STRL SZ7.5 (GLOVE) ×2 IMPLANT
GLOVE BIOGEL PI IND STRL 7.5 (GLOVE) ×1 IMPLANT
GLOVE BIOGEL PI IND STRL 8.5 (GLOVE) ×2 IMPLANT
GLOVE BIOGEL PI INDICATOR 7.5 (GLOVE) ×1
GLOVE BIOGEL PI INDICATOR 8.5 (GLOVE) ×2
GLOVE SURG ORTHO 8.0 STRL STRW (GLOVE) ×6 IMPLANT
GOWN STRL REUS W/ TWL XL LVL3 (GOWN DISPOSABLE) ×2 IMPLANT
GOWN STRL REUS W/TWL 2XL LVL3 (GOWN DISPOSABLE) ×2 IMPLANT
GOWN STRL REUS W/TWL XL LVL3 (GOWN DISPOSABLE) ×4
HANDPIECE INTERPULSE COAX TIP (DISPOSABLE) ×2
HOOD PEEL AWAY FLYTE STAYCOOL (MISCELLANEOUS) ×6 IMPLANT
MANIFOLD NEPTUNE II (INSTRUMENTS) ×2 IMPLANT
NS IRRIG 1000ML POUR BTL (IV SOLUTION) ×2 IMPLANT
PACK TOTAL KNEE CUSTOM (KITS) ×2 IMPLANT
PROTECTOR NERVE ULNAR (MISCELLANEOUS) ×2 IMPLANT
SET HNDPC FAN SPRY TIP SCT (DISPOSABLE) ×1 IMPLANT
STEM POLY PAT PLY 32M KNEE (Knees) ×1 IMPLANT
STEM TIBIA 5 DEG SZ C R KNEE (Knees) IMPLANT
STRIP CLOSURE SKIN 1/2X4 (GAUZE/BANDAGES/DRESSINGS) ×2 IMPLANT
SUT BONE WAX W31G (SUTURE) ×2 IMPLANT
SUT MNCRL AB 3-0 PS2 18 (SUTURE) ×2 IMPLANT
SUT STRATAFIX 0 PDS 27 VIOLET (SUTURE) ×2
SUT STRATAFIX PDS+ 0 24IN (SUTURE) ×2 IMPLANT
SUT VIC AB 1 CT1 36 (SUTURE) ×3 IMPLANT
SUTURE STRATFX 0 PDS 27 VIOLET (SUTURE) ×1 IMPLANT
SYR CONTROL 10ML LL (SYRINGE) ×4 IMPLANT
TIBIA STEM 5 DEG SZ C R KNEE (Knees) ×2 IMPLANT
TRAY FOLEY MTR SLVR 16FR STAT (SET/KITS/TRAYS/PACK) ×2 IMPLANT
WATER STERILE IRR 1000ML POUR (IV SOLUTION) ×4 IMPLANT
WRAP KNEE MAXI GEL POST OP (GAUZE/BANDAGES/DRESSINGS) ×2 IMPLANT
YANKAUER SUCT BULB TIP 10FT TU (MISCELLANEOUS) ×2 IMPLANT

## 2018-08-23 NOTE — Anesthesia Procedure Notes (Signed)
   Anesthesia Regional Block: Adductor canal block   Pre-Anesthetic Checklist: ,, timeout performed, Correct Patient, Correct Site, Correct Laterality, Correct Procedure, Correct Position, site marked, Risks and benefits discussed, pre-op evaluation,  At surgeon's request and post-op pain management  Laterality: Right  Prep: Maximum Sterile Barrier Precautions used, chloraprep       Needles:  Injection technique: Single-shot  Needle Type: Echogenic Stimulator Needle     Needle Length: 9cm  Needle Gauge: 21     Additional Needles:   Procedures:,,,, ultrasound used (permanent image in chart),,,,  Narrative:  Start time: 08/23/2018 10:15 AM End time: 08/23/2018 10:25 AM Injection made incrementally with aspirations every 5 mL.  Performed by: Personally  Anesthesiologist: Kipp BroodJoslin, Haly Feher, MD  Additional Notes: 15 cc 0.75% Ropivacaine injected easily

## 2018-08-23 NOTE — H&P (Signed)
[name] MRN:  [id number] DOB/SEX:  12-May-1956/female  CHIEF COMPLAINT:  Painful right Knee  HISTORY: Patient is a 63 y.o. female presented with a history of pain in the right knee. Onset of symptoms was gradual starting a few years ago with gradually worsening course since that time. Patient has been treated conservatively with over-the-counter NSAIDs and activity modification. Patient currently rates pain in the knee at 10 out of 10 with activity. There is pain at night.  PAST MEDICAL HISTORY: Patient Active Problem List   Diagnosis Date Noted  . Breast calcification, right 11/30/2016  . Right knee pain 10/06/2016  . Essential hypertension, benign 12/05/2014  . Rhinitis, allergic 12/05/2014  . Acute anxiety 12/05/2014  . Obesity 12/05/2014  . Abnormal weight gain 12/05/2014  . Hyperlipidemia 12/05/2014   Past Medical History:  Diagnosis Date  . Anxiety   . Arthritis    OA  . GERD (gastroesophageal reflux disease)   . Headache    HX OF MIGRAINES YEARS AGO  . Hypertension   . Sleep apnea    HX OF HAD SURGERY FOR YEARS AGO   Past Surgical History:  Procedure Laterality Date  . ABDOMINAL HYSTERECTOMY     OVARIES REMOVED LATER  . ARTHROSOPIC KNEE SURGERY Right 2010, APRIL 2017  . BONE SPURS REMOVED FROM BOTH FEET    . CHOLECYSTECTOMY     LAPROSCOPIC  . NASAL SINUS SURGERY  YRS AGO  . THROAT SURGERY  YRS AGO   TO ENLARGE TTHROAT OPENING  . TOE NAIL REMOVED RIGHT BIG FOOT    . TUBAL LIGATION       MEDICATIONS:   Medications Prior to Admission  Medication Sig Dispense Refill Last Dose  . acetaminophen (TYLENOL) 500 MG tablet Take 1,000 mg by mouth every 6 (six) hours as needed.   08/22/2018 at Unknown time  . ALPRAZolam (XANAX) 0.5 MG tablet TAKE 1 TABLET THREE TIMES DAILY AS NEEDED FOR ANXIETY (Patient taking differently: Take 0.5 mg by mouth 3 (three) times daily as needed for anxiety. ) 90 tablet 0 08/23/2018 at 0600  . hydrochlorothiazide (HYDRODIURIL) 25 MG  tablet Take 1 tablet (25 mg total) by mouth daily. 90 tablet 0 08/22/2018 at Unknown time  . lisinopril (PRINIVIL,ZESTRIL) 40 MG tablet Take 1 tablet (40 mg total) by mouth daily. 90 tablet 0 08/22/2018 at Unknown time  . PRESCRIPTION MEDICATION Take 3 tablets by mouth daily. Cosamine ASU   08/22/2018 at Unknown time    ALLERGIES:   Allergies  Allergen Reactions  . Dilaudid [Hydromorphone] Anaphylaxis  . Augmentin [Amoxicillin-Pot Clavulanate] Nausea And Vomiting  . Latex Other (See Comments)    *GLOVES - breaks skin out   . Norco [Hydrocodone-Acetaminophen] Nausea And Vomiting    REVIEW OF SYSTEMS:  A comprehensive review of systems was negative except for: Musculoskeletal: positive for arthralgias and bone pain   FAMILY HISTORY:   Family History  Problem Relation Age of Onset  . Cancer Mother        lung  . Hypertension Mother   . Cancer Paternal Aunt        ovarian  . Hypertension Paternal Aunt   . Diabetes Maternal Grandmother   . Hypertension Maternal Grandmother   . Diabetes Paternal Grandmother   . Hypertension Paternal Grandmother   . Diabetes Paternal Grandfather   . Hypertension Paternal Grandfather     SOCIAL HISTORY:   Social History   Tobacco Use  . Smoking status: Former Smoker    Packs/day: 1.50  Years: 25.00    Pack years: 37.50    Types: Cigarettes  . Smokeless tobacco: Never Used  . Tobacco comment: QUIT 20 YRS AGO  Substance Use Topics  . Alcohol use: Not Currently    Alcohol/week: 0.0 standard drinks     EXAMINATION:  Vital signs in last 24 hours: Temp:  [97.4 F (36.3 C)] 97.4 F (36.3 C) (11/25 0840) Pulse Rate:  [74] 74 (11/25 0840) Resp:  [20] 20 (11/25 0840) BP: (119)/(76) 119/76 (11/25 0840) SpO2:  [95 %] 95 % (11/25 0840) Weight:  [101.6 kg] 101.6 kg (11/25 0855)  BP 119/76   Pulse 74   Temp (!) 97.4 F (36.3 C) (Oral)   Resp 20   Ht 5' 3.5" (1.613 m)   Wt 101.6 kg   SpO2 95%   BMI 39.06 kg/m   General Appearance:     Alert, cooperative, no distress, appears stated age  Head:    Normocephalic, without obvious abnormality, atraumatic  Eyes:    PERRL, conjunctiva/corneas clear, EOM's intact, fundi    benign, both eyes  Ears:    Normal TM's and external ear canals, both ears  Nose:   Nares normal, septum midline, mucosa normal, no drainage    or sinus tenderness  Throat:   Lips, mucosa, and tongue normal; teeth and gums normal  Neck:   Supple, symmetrical, trachea midline, no adenopathy;    thyroid:  no enlargement/tenderness/nodules; no carotid   bruit or JVD  Back:     Symmetric, no curvature, ROM normal, no CVA tenderness  Lungs:     Clear to auscultation bilaterally, respirations unlabored  Chest Wall:    No tenderness or deformity   Heart:    Regular rate and rhythm, S1 and S2 normal, no murmur, rub   or gallop  Breast Exam:    No tenderness, masses, or nipple abnormality  Abdomen:     Soft, non-tender, bowel sounds active all four quadrants,    no masses, no organomegaly  Genitalia:    Normal female without lesion, discharge or tenderness  Rectal:    Normal tone, no masses or tenderness;   guaiac negative stool  Extremities:   Extremities normal, atraumatic, no cyanosis or edema  Pulses:   2+ and symmetric all extremities  Skin:   Skin color, texture, turgor normal, no rashes or lesions  Lymph nodes:   Cervical, supraclavicular, and axillary nodes normal  Neurologic:   CNII-XII intact, normal strength, sensation and reflexes    throughout    Musculoskeletal:  ROM 0-120, Ligaments intact,  Imaging Review Plain radiographs demonstrate severe degenerative joint disease of the right knee. The overall alignment is neutral. The bone quality appears to be good for age and reported activity level.  Assessment/Plan: Primary osteoarthritis, right knee   The patient history, physical examination and imaging studies are consistent with advanced degenerative joint disease of the right knee. The patient  has failed conservative treatment.  The clearance notes were reviewed.  After discussion with the patient it was felt that Total Knee Replacement was indicated. The procedure,  risks, and benefits of total knee arthroplasty were presented and reviewed. The risks including but not limited to aseptic loosening, infection, blood clots, vascular injury, stiffness, patella tracking problems complications among others were discussed. The patient acknowledged the explanation, agreed to proceed with the plan.  Preoperative templating of the joint replacement has been completed, documented, and submitted to the Operating Room personnel in order to optimize intra-operative equipment management.  Patient's anticipated LOS is less than 2 midnights, meeting these requirements: - Younger than 4265 - Lives within 1 hour of care - Has a competent adult at home to recover with post-op recover - NO history of  - Chronic pain requiring opiods  - Diabetes  - Coronary Artery Disease  - Heart failure  - Heart attack  - Stroke  - DVT/VTE  - Cardiac arrhythmia  - Respiratory Failure/COPD  - Renal failure  - Anemia  - Advanced Liver disease       Guy SandiferColby Alan Jasmon Mattice 08/23/2018, 9:05 AM

## 2018-08-23 NOTE — Evaluation (Signed)
Physical Therapy Evaluation Patient Details Name: [name] MRN: [id number] DOB: 07/23/1956 Today's Date: 08/23/2018   History of Present Illness  62 yo female s/p R TKR on 08/23/18. PMH includes HTN, anxiety, depression, HLD, OA, sleep apnea, R knee arthroscopy x2.   Clinical Impression   Pt presents with R knee pain, decreased R knee ROM, difficulty performing mobility tasks, and decreased tolerance for ambulation. Pt with nausea and dry heaving after 12 ft ambulation, alleviated with rest. Additionally, pt with mild pt- and PT-corrected R knee buckling for last 6 ft of ambulation. Pt to benefit from acute PT to address deficits. PT to progress mobility as tolerated, and will continue to follow acutely.     Follow Up Recommendations Follow surgeon's recommendation for DC plan and follow-up therapies;Supervision for mobility/OOB(HHPT)    Equipment Recommendations  None recommended by PT    Recommendations for Other Services       Precautions / Restrictions Precautions Precautions: Fall Restrictions Weight Bearing Restrictions: No      Mobility  Bed Mobility Overal bed mobility: Needs Assistance Bed Mobility: Supine to Sit     Supine to sit: Min assist;HOB elevated     General bed mobility comments: Min assist for trunk elevation, RLE management, scooting to EOB. PT also assisted with slowly lowering RLE to ground.   Transfers Overall transfer level: Needs assistance Equipment used: Rolling walker (2 wheeled) Transfers: Sit to/from Stand Sit to Stand: Min assist;From elevated surface         General transfer comment: Min assist for power up, steadying upon standing. Pt with weight shifting L and R without difficulty.   Ambulation/Gait Ambulation/Gait assistance: Min guard;Min assist Gait Distance (Feet): 12 Feet Assistive device: Rolling walker (2 wheeled) Gait Pattern/deviations: Step-to pattern;Decreased stride length;Decreased weight shift to  right;Decreased stance time - right;Antalgic Gait velocity: decr    General Gait Details: Min assist for R knee guarding, verbal cuing for placement in RW, sequencing, turning. Pt stated after 6 ft ambulation that her knee felt "off", PT with close guarding and noted mild buckling, pt- and PT-corrected. Pt also with increasing pain which limited tolerance to ambulation. After ambulation, pt with dry heaving at EOB, no emesis noted. Cool cloth applied to pt's forehead.    Stairs            Wheelchair Mobility    Modified Rankin (Stroke Patients Only)       Balance Overall balance assessment: Mild deficits observed, not formally tested                                           Pertinent Vitals/Pain Pain Assessment: 0-10 Pain Score: 6  Pain Location: R knee  Pain Descriptors / Indicators: Burning Pain Intervention(s): Repositioned;Limited activity within patient's tolerance;Ice applied;Monitored during session;RN gave pain meds during session    Home Living Family/patient expects to be discharged to:: Private residence Living Arrangements: Spouse/significant other Available Help at Discharge: Family;Available 24 hours/day(Pt's aunt to stay with her during the day while boyfriend works.) Type of Home: Mobile home Home Access: Ramped entrance     Home Layout: One level Home Equipment: Bedside commode;Walker - 2 wheels;Cane - single point      Prior Function Level of Independence: Independent with assistive device(s)         Comments: using the cane as needed PTA. Pt is a Sales executivedental assistant.  Hand Dominance   Dominant Hand: Right    Extremity/Trunk Assessment   Upper Extremity Assessment Upper Extremity Assessment: Overall WFL for tasks assessed    Lower Extremity Assessment Lower Extremity Assessment: Generalized weakness;RLE deficits/detail RLE Deficits / Details: Suspected post-surgical weakness; able to perform weak quad set, ankle  pumps, SLR with assist  RLE Sensation: decreased light touch(R foot felt "like pins and needles")    Cervical / Trunk Assessment Cervical / Trunk Assessment: Normal  Communication   Communication: No difficulties  Cognition Arousal/Alertness: Awake/alert Behavior During Therapy: WFL for tasks assessed/performed Overall Cognitive Status: Within Functional Limits for tasks assessed                                        General Comments      Exercises Total Joint Exercises Goniometric ROM: knee aarom ~10-45*, limited by pain and stiffness   Assessment/Plan    PT Assessment Patient needs continued PT services  PT Problem List Decreased strength;Pain;Decreased range of motion;Decreased activity tolerance;Decreased knowledge of use of DME;Decreased balance;Decreased safety awareness;Decreased mobility       PT Treatment Interventions DME instruction;Therapeutic activities;Gait training;Therapeutic exercise;Patient/family education;Balance training;Functional mobility training    PT Goals (Current goals can be found in the Care Plan section)  Acute Rehab PT Goals Patient Stated Goal: none stated  PT Goal Formulation: With patient Time For Goal Achievement: 08/30/18 Potential to Achieve Goals: Good    Frequency 7X/week   Barriers to discharge        Co-evaluation               AM-PAC PT "6 Clicks" Mobility  Outcome Measure Help needed turning from your back to your side while in a flat bed without using bedrails?: A Little Help needed moving from lying on your back to sitting on the side of a flat bed without using bedrails?: A Lot Help needed moving to and from a bed to a chair (including a wheelchair)?: A Little Help needed standing up from a chair using your arms (e.g., wheelchair or bedside chair)?: A Little Help needed to walk in hospital room?: A Little Help needed climbing 3-5 steps with a railing? : A Lot 6 Click Score: 16    End of  Session Equipment Utilized During Treatment: Gait belt Activity Tolerance: Patient limited by pain;Patient limited by fatigue Patient left: in bed;with bed alarm set;with call bell/phone within reach;with family/visitor present;with SCD's reapplied Nurse Communication: Mobility status;Patient requests pain meds;Other (comment)(Pt requesting anxiety medication ) PT Visit Diagnosis: Other abnormalities of gait and mobility (R26.89);Difficulty in walking, not elsewhere classified (R26.2)    Time: 1610-9604 PT Time Calculation (min) (ACUTE ONLY): 25 min   Charges:   PT Evaluation $PT Eval Low Complexity: 1 Low PT Treatments $Gait Training: 8-22 mins        [name], PT Acute Rehabilitation Services Pager [phone]  Office [phone]   [name] 08/23/2018, 7:34 PM

## 2018-08-23 NOTE — Progress Notes (Signed)
AssistedDr. Joslin with right, ultrasound guided, adductor canal block. Side rails up, monitors on throughout procedure. See vital signs in flow sheet. Tolerated Procedure well.  

## 2018-08-23 NOTE — Transfer of Care (Signed)
Immediate Anesthesia Transfer of Care Note  Patient: [name]  Procedure(s) Performed: TOTAL KNEE ARTHROPLASTY (Right Knee)  Patient Location: PACU  Anesthesia Type:MAC and Spinal  Level of Consciousness: awake and alert   Airway & Oxygen Therapy: Patient Spontanous Breathing and Patient connected to face mask oxygen  Post-op Assessment: Report given to RN and Post -op Vital signs reviewed and stable  Post vital signs: Reviewed and stable  Last Vitals:  Vitals Value Taken Time  BP 102/64 08/23/2018 12:42 PM  Temp    Pulse 85 08/23/2018 12:44 PM  Resp 8 08/23/2018 12:44 PM  SpO2 96 % 08/23/2018 12:44 PM  Vitals shown include unvalidated device data.  Last Pain:  Vitals:   08/23/18 0840  TempSrc: Oral         Complications: No apparent anesthesia complications

## 2018-08-23 NOTE — Anesthesia Postprocedure Evaluation (Signed)
Anesthesia Post Note  Patient: [name]  Procedure(s) Performed: TOTAL KNEE ARTHROPLASTY (Right Knee)     Patient location during evaluation: PACU Anesthesia Type: Spinal Level of consciousness: oriented and awake and alert Pain management: satisfactory to patient Vital Signs Assessment: post-procedure vital signs reviewed and stable Respiratory status: spontaneous breathing, respiratory function stable and patient connected to nasal cannula oxygen Cardiovascular status: blood pressure returned to baseline and stable Postop Assessment: no headache, no backache and no apparent nausea or vomiting Anesthetic complications: no    Last Vitals:  Vitals:   08/23/18 1500 08/23/18 1523  BP: 114/71 126/76  Pulse: 66 70  Resp: 14 14  Temp:    SpO2: 95% 100%    Last Pain:  Vitals:   08/23/18 1452  TempSrc:   PainSc: Asleep                 Tavis Kring COKER

## 2018-08-23 NOTE — Anesthesia Procedure Notes (Signed)
Spinal  Patient location during procedure: OR Start time: 08/23/2018 10:40 AM End time: 08/23/2018 10:45 AM Staffing Anesthesiologist: Kipp BroodJoslin, Brithney Bensen, MD Performed: anesthesiologist  Preanesthetic Checklist Completed: patient identified, site marked, surgical consent, pre-op evaluation, timeout performed, IV checked, risks and benefits discussed and monitors and equipment checked Spinal Block Patient position: sitting Prep: ChloraPrep Patient monitoring: heart rate, cardiac monitor, continuous pulse ox and blood pressure Approach: midline Location: L3-4 Injection technique: single-shot Needle Needle type: Whitacre  Needle gauge: 24 G Needle length: 9 cm Needle insertion depth: 5 cm Assessment Sensory level: T6 Additional Notes 12 mg 0.75% Bupivacaine injected easily

## 2018-08-23 NOTE — Anesthesia Preprocedure Evaluation (Addendum)
Anesthesia Evaluation  Patient identified by MRN, date of birth, ID band Patient awake    Reviewed: Allergy & Precautions, NPO status , Patient's Chart, lab work & pertinent test results  Airway Mallampati: II  TM Distance: >3 FB Neck ROM: Full    Dental  (+) Teeth Intact, Dental Advisory Given   Pulmonary former smoker,    breath sounds clear to auscultation       Cardiovascular hypertension,  Rhythm:Regular Rate:Normal     Neuro/Psych    GI/Hepatic   Endo/Other    Renal/GU      Musculoskeletal   Abdominal   Peds  Hematology   Anesthesia Other Findings   Reproductive/Obstetrics                            Anesthesia Physical Anesthesia Plan  ASA: III  Anesthesia Plan: Spinal   Post-op Pain Management:    Induction: Intravenous  PONV Risk Score and Plan: Ondansetron and Propofol infusion  Airway Management Planned: Nasal Cannula, Natural Airway and Simple Face Mask  Additional Equipment:   Intra-op Plan:   Post-operative Plan:   Informed Consent: I have reviewed the patients History and Physical, chart, labs and discussed the procedure including the risks, benefits and alternatives for the proposed anesthesia with the patient or authorized representative who has indicated his/her understanding and acceptance.   Dental advisory given  Plan Discussed with: CRNA and Anesthesiologist  Anesthesia Plan Comments:         Anesthesia Quick Evaluation

## 2018-08-24 ENCOUNTER — Encounter (HOSPITAL_COMMUNITY): Payer: Self-pay | Admitting: Orthopedic Surgery

## 2018-08-24 DIAGNOSIS — M1711 Unilateral primary osteoarthritis, right knee: Secondary | ICD-10-CM | POA: Diagnosis not present

## 2018-08-24 LAB — BASIC METABOLIC PANEL
Anion gap: 6 (ref 5–15)
BUN: 14 mg/dL (ref 6–20)
CHLORIDE: 103 mmol/L (ref 98–111)
CO2: 30 mmol/L (ref 22–32)
CREATININE: 0.63 mg/dL (ref 0.44–1.00)
Calcium: 8.5 mg/dL — ABNORMAL LOW (ref 8.9–10.3)
GFR calc Af Amer: >60 mL/min (ref >60)
GFR calc non Af Amer: >60 mL/min (ref >60)
GLUCOSE: 145 mg/dL — AB (ref 70–99)
Potassium: 3.7 mmol/L (ref 3.5–5.1)
Sodium: 139 mmol/L (ref 135–145)

## 2018-08-24 LAB — CBC
HEMATOCRIT: 39.9 % (ref 36.0–46.0)
Hemoglobin: 12.4 g/dL (ref 12.0–15.0)
MCH: 29 pg (ref 26.0–34.0)
MCHC: 31.1 g/dL (ref 30.0–36.0)
MCV: 93.4 fL (ref 80.0–100.0)
Platelets: 233 10*3/uL (ref 150–400)
RBC: 4.27 MIL/uL (ref 3.87–5.11)
RDW: 13.6 % (ref 11.5–15.5)
WBC: 13.5 10*3/uL — ABNORMAL HIGH (ref 4.0–10.5)
nRBC: 0 % (ref 0.0–0.2)

## 2018-08-24 MED ORDER — OXYCODONE HCL 5 MG PO TABS: 5.0000 mg | ORAL_TABLET | Freq: Four times a day (QID) | ORAL | 0 refills | Status: AC | PRN

## 2018-08-24 MED ORDER — ASPIRIN 325 MG PO TBEC: 325.0000 mg | DELAYED_RELEASE_TABLET | Freq: Two times a day (BID) | ORAL | 0 refills | Status: AC

## 2018-08-24 MED ORDER — METHOCARBAMOL 500 MG PO TABS: 500.0000 mg | ORAL_TABLET | Freq: Four times a day (QID) | ORAL | 0 refills | Status: AC | PRN

## 2018-08-24 NOTE — Op Note (Signed)
TOTAL KNEE REPLACEMENT OPERATIVE NOTE:  08/23/2018  9:10 AM  PATIENT:  Michail Jewelsebra G Donahue  62 y.o. female  PRE-OPERATIVE DIAGNOSIS:  RT. Knee Osteoaarthritis  POST-OPERATIVE DIAGNOSIS:  right  Knee Osteoaarthritis  PROCEDURE:  Procedure(s): TOTAL KNEE ARTHROPLASTY  SURGEON:  Surgeon(s): Dannielle HuhLucey, Harrie Cazarez, MD  PHYSICIAN ASSISTANT:Blair Su Hiltoberts, PA-C  ANESTHESIA:   spinal  SPECIMEN: None  COUNTS:  Correct  TOURNIQUET:   Total Tourniquet Time Documented: Thigh (Right) - 41 minutes Total: Thigh (Right) - 41 minutes   DICTATION:  Indication for procedure:    The patient is a 62 y.o. female who has failed conservative treatment for RT. Knee Osteoaarthritis.  Informed consent was obtained prior to anesthesia. The risks versus benefits of the operation were explain and in a way the patient can, and did, understand.   On the implant demand matching protocol, this patient scored 9.  Therefore, this patient was not receive a polyethylene insert with vitamin E which is a high demand implant.  Description of procedure:     The patient was taken to the operating room and placed under anesthesia.  The patient was positioned in the usual fashion taking care that all body parts were adequately padded and/or protected.  A tourniquet was applied and the leg prepped and draped in the usual sterile fashion.  The extremity was exsanguinated with the esmarch and tourniquet inflated to 350 mmHg.  Pre-operative range of motion was normal.  The knee was in 5 degree of mild varus.  A midline incision approximately 6-7 inches long was made with a #10 blade.  A new blade was used to make a parapatellar arthrotomy going 2-3 cm into the quadriceps tendon, over the patella, and alongside the medial aspect of the patellar tendon.  A synovectomy was then performed with the #10 blade and forceps. I then elevated the deep MCL off the medial tibial metaphysis subperiosteally around to the semimembranosus attachment.     I everted the patella and used calipers to measure patellar thickness.  I used the reamer to ream down to appropriate thickness to recreate the native thickness.  I then removed excess bone with the rongeur and sagittal saw.  I used the appropriately sized template and drilled the three lug holes.  I then put the trial in place and measured the thickness with the calipers to ensure recreation of the native thickness.  The trial was then removed and the patella subluxed and the knee brought into flexion.  A homan retractor was place to retract and protect the patella and lateral structures.  A Z-retractor was place medially to protect the medial structures.  The extra-medullary alignment system was used to make cut the tibial articular surface perpendicular to the anamotic axis of the tibia and in 3 degrees of posterior slope.  The cut surface and alignment jig was removed.  I then used the intramedullary alignment guide to make a 6 valgus cut on the distal femur.  I then marked out the epicondylar axis on the distal femur.  The posterior condylar axis measured 3 degrees.  I then used the anterior referencing sizer and measured the femur to be a size 7.  The 4-In-1 cutting block was screwed into place in external rotation matching the posterior condylar angle, making our cuts perpendicular to the epicondylar axis.  Anterior, posterior and chamfer cuts were made with the sagittal saw.  The cutting block and cut pieces were removed.  A lamina spreader was placed in 90 degrees of flexion.  The ACL, PCL, menisci, and posterior condylar osteophytes were removed.  A 10 mm spacer blocked was found to offer good flexion and extension gap balance after mild in degree releasing.   The scoop retractor was then placed and the femoral finishing block was pinned in place.  The small sagittal saw was used as well as the lug drill to finish the femur.  The block and cut surfaces were removed and the medullary canal hole  filled with autograft bone from the cut pieces.  The tibia was delivered forward in deep flexion and external rotation.  A size C tray was selected and pinned into place centered on the medial 1/3 of the tibial tubercle.  The reamer and keel was used to prepare the tibia through the tray.    I then trialed with the size 7 femur, size C tibia, a 10 mm insert and the 32 patella.  I had excellent flexion/extension gap balance, excellent patella tracking.  Flexion was full and beyond 120 degrees; extension was zero.  These components were chosen and the staff opened them to me on the back table while the knee was lavaged copiously and the cement mixed.  The soft tissue was infiltrated with 60cc of exparel 1.3% through a 21 gauge needle.  I cemented in the components and removed all excess cement.  The polyethylene tibial component was snapped into place and the knee placed in extension while cement was hardening.  The capsule was infilltrated with a 60cc exparel/marcaine/saline mixture.   Once the cement was hard, the tourniquet was let down.  Hemostasis was obtained.  The arthrotomy was closed using a #1 stratofix running suture.  The deep soft tissues were closed with #0 vicryls and the subcuticular layer closed with #2-0 vicryl.  The skin was reapproximated and closed with 3.0 Monocryl.  The wound was covered with steristrips, aquacel dressing, and a TED stocking.   The patient was then awakened, extubated, and taken to the recovery room in stable condition.  BLOOD LOSS:  300cc COMPLICATIONS:  None.  PLAN OF CARE: Admit for overnight observation  PATIENT DISPOSITION:  PACU - hemodynamically stable.   Delay start of Pharmacological VTE agent (>24hrs) due to surgical blood loss or risk of bleeding:  not applicable  Please fax a copy of this op note to my office at [phone] (please only include page 1 and 2 of the Case Information op note)

## 2018-08-24 NOTE — Progress Notes (Signed)
Physical Therapy Treatment Patient Details Name: [name] MRN: [id number] DOB: 10/26/1955 Today's Date: 08/24/2018    History of Present Illness 62 yo female s/p R TKR on 08/23/18. PMH includes HTN, anxiety, depression, HLD, OA, sleep apnea, R knee arthroscopy x2.     PT Comments    Patient progressing with mobility despite pain, fatigue and pre-syncopal feelings.  She will continue to benefit from skilled PT in the acute setting prior to d/c home with assist and follow up PT.   Follow Up Recommendations  Follow surgeon's recommendation for DC plan and follow-up therapies;Supervision for mobility/OOB(HHPT)     Equipment Recommendations  None recommended by PT    Recommendations for Other Services       Precautions / Restrictions Precautions Precautions: Fall Restrictions Other Position/Activity Restrictions: WBAT    Mobility  Bed Mobility Overal bed mobility: Needs Assistance Bed Mobility: Supine to Sit     Supine to sit: Min assist;HOB elevated     General bed mobility comments: assist for R LE management  Transfers Overall transfer level: Needs assistance Equipment used: Rolling walker (2 wheeled) Transfers: Sit to/from Stand Sit to Stand: Min guard         General transfer comment: cues for hand placement, pushed up on footboard  Ambulation/Gait   Gait Distance (Feet): 70 Feet Assistive device: Rolling walker (2 wheeled) Gait Pattern/deviations: Step-to pattern;Decreased stride length;Trunk flexed;Antalgic     General Gait Details: Heavy reliance on RW with c/o arm fatigue.  c/o feeling pre-syncopal and noted pallor in lips; able to make it back to chair for therex   Stairs             Wheelchair Mobility    Modified Rankin (Stroke Patients Only)       Balance Overall balance assessment: Mild deficits observed, not formally tested                                          Cognition Arousal/Alertness:  Awake/alert Behavior During Therapy: WFL for tasks assessed/performed Overall Cognitive Status: Within Functional Limits for tasks assessed                                        Exercises Total Joint Exercises Ankle Circles/Pumps: AROM;10 reps;Both;Supine Quad Sets: AROM;10 reps;Right;Seated Short Arc Quad: AROM;AAROM;10 reps;Right;Seated Heel Slides: AROM;10 reps;Right;Seated(foot on floor) Hip ABduction/ADduction: AROM;10 reps;Right Straight Leg Raises: AROM;5 reps;Right;Seated    General Comments        Pertinent Vitals/Pain Pain Assessment: Faces Faces Pain Scale: Hurts whole lot Pain Location: R knee  Pain Descriptors / Indicators: Burning;Aching(with activity) Pain Intervention(s): Monitored during session;Repositioned;Ice applied    Home Living                      Prior Function            PT Goals (current goals can now be found in the care plan section) Progress towards PT goals: Progressing toward goals    Frequency           PT Plan Current plan remains appropriate    Co-evaluation              AM-PAC PT "6 Clicks" Mobility   Outcome Measure  Help needed turning from your back to  your side while in a flat bed without using bedrails?: A Little Help needed moving from lying on your back to sitting on the side of a flat bed without using bedrails?: A Little Help needed moving to and from a bed to a chair (including a wheelchair)?: A Little Help needed standing up from a chair using your arms (e.g., wheelchair or bedside chair)?: A Little Help needed to walk in hospital room?: A Little Help needed climbing 3-5 steps with a railing? : A Lot 6 Click Score: 17    End of Session Equipment Utilized During Treatment: Gait belt Activity Tolerance: Patient limited by pain;Patient limited by fatigue Patient left: in chair;with call bell/phone within reach Nurse Communication: Mobility status PT Visit Diagnosis: Other  abnormalities of gait and mobility (R26.89);Difficulty in walking, not elsewhere classified (R26.2)     Time: 1610-9604 PT Time Calculation (min) (ACUTE ONLY): 25 min  Charges:  $Gait Training: 8-22 mins $Therapeutic Exercise: 8-22 mins                     Sheran Lawless, [facility] [phone] 08/24/2018    [name] 08/24/2018, 12:28 PM

## 2018-08-24 NOTE — Addendum Note (Signed)
Addendum  created 08/24/18 0615 by Takao Lizer J, CRNA   Charge Capture section accepted    

## 2018-08-24 NOTE — Care Management Note (Signed)
Case Management Note  Patient Details  Name: [name] MRN: [id number] Date of Birth: 11/27/1955  Subjective/Objective:    Discharge planning, spoke with patient and spouse at bedside. [facility].  Action/Plan: [facility] Has DME. [phone]                Expected Discharge Date:  08/24/18               Expected Discharge Plan:  Home w Home Health Services  In-House Referral:  NA  Discharge planning Services  CM Consult  Post Acute Care Choice:  Home Health Choice offered to:  Patient  DME Arranged:  N/A DME Agency:  NA  HH Arranged:  PT HH Agency:  [facility]  Status of Service:  Completed, signed off  If discussed at [employer] Meetings, dates discussed:    Additional Comments:  Alexis Goodelleele, Kashari Chalmers K, RN 08/24/2018, 1:02 PM

## 2018-08-24 NOTE — Progress Notes (Signed)
Physical Therapy Treatment Patient Details Name: [name] MRN: [id number] DOB: [birth year] Today's Date: 08/24/2018    History of Present Illness 62 yo female s/p R TKR on 08/23/18. PMH includes HTN, anxiety, depression, HLD, OA, sleep apnea, R knee arthroscopy x2.     PT Comments    Patient progressing with ambulation distance, still weak and woozy with limited activity tolerance and pain in R calf all during ambulation (RN aware).  Feel she needs another session with PT prior to d/c home to ensure safety with mobility and ease of transitions with less difficulty managing R LE.    Follow Up Recommendations  Follow surgeon's recommendation for DC plan and follow-up therapies;Supervision for mobility/OOB     Equipment Recommendations  None recommended by PT    Recommendations for Other Services       Precautions / Restrictions Precautions Precautions: Fall Restrictions Other Position/Activity Restrictions: WBAT    Mobility  Bed Mobility Overal bed mobility: Needs Assistance       Supine to sit: Min assist;HOB elevated     General bed mobility comments: assist for R LE management as painful when unsupported  Transfers Overall transfer level: Needs assistance Equipment used: Rolling walker (2 wheeled) Transfers: Sit to/from Stand Sit to Stand: Min guard         General transfer comment: cues for hand placement on bedrail  Ambulation/Gait Ambulation/Gait assistance: Min guard Gait Distance (Feet): 110 Feet Assistive device: Rolling walker (2 wheeled) Gait Pattern/deviations: Step-to pattern;Decreased stride length;Antalgic     General Gait Details: cues for relaxing arms until stepping with L to reduce arm fatigue.  C/o wooziness with ambulation, though not pale this pm.    Stairs             Wheelchair Mobility    Modified Rankin (Stroke Patients Only)       Balance Overall balance assessment: Needs assistance   Sitting balance-Leahy  Scale: Good       Standing balance-Leahy Scale: Fair                              Cognition Arousal/Alertness: Awake/alert Behavior During Therapy: WFL for tasks assessed/performed Overall Cognitive Status: Within Functional Limits for tasks assessed                                        Exercises Total Joint Exercises Ankle Circles/Pumps: AROM;10 reps;Both;Supine Quad Sets: AROM;10 reps;Right;Supine Short Arc Quad: AROM;AAROM;10 reps;Right;Supine Heel Slides: AROM;10 reps;Right;Supine;AAROM Hip ABduction/ADduction: AROM;10 reps;Right;Supine Straight Leg Raises: AROM;Right;Seated;5 reps;Supine    General Comments General comments (skin integrity, edema, etc.): friend present and supportive with PT this pm      Pertinent Vitals/Pain Pain Score: 6  Pain Location: R knee  Pain Descriptors / Indicators: Burning;Aching Pain Intervention(s): Repositioned;Monitored during session;Premedicated before session    Home Living                      Prior Function            PT Goals (current goals can now be found in the care plan section) Progress towards PT goals: Progressing toward goals    Frequency    7X/week      PT Plan Current plan remains appropriate    Co-evaluation  AM-PAC PT "6 Clicks" Mobility   Outcome Measure  Help needed turning from your back to your side while in a flat bed without using bedrails?: A Little Help needed moving from lying on your back to sitting on the side of a flat bed without using bedrails?: A Little Help needed moving to and from a bed to a chair (including a wheelchair)?: A Little Help needed standing up from a chair using your arms (e.g., wheelchair or bedside chair)?: A Little Help needed to walk in hospital room?: A Little Help needed climbing 3-5 steps with a railing? : A Lot 6 Click Score: 17    End of Session Equipment Utilized During Treatment: Gait belt Activity  Tolerance: Patient limited by fatigue Patient left: with call bell/phone within reach;in bed;in CPM   PT Visit Diagnosis: Other abnormalities of gait and mobility (R26.89);Difficulty in walking, not elsewhere classified (R26.2)     Time: [id number] PT Time Calculation (min) (ACUTE ONLY): 34 min  Charges:  $Gait Training: 8-22 mins $Therapeutic Exercise: 8-22 mins                     Sheran LawlessCyndi Ora Bollig, [place] [facility] [phone] 08/24/2018    Elray Mcgregorynthia Claretta Kendra 08/24/2018, 5:01 PM

## 2018-08-24 NOTE — Discharge Summary (Signed)
SPORTS MEDICINE & JOINT REPLACEMENT   Georgena Spurling, MD   Laurier Nancy, PA-C [address] [place], [place], [place]  [address]                             [phone]  PATIENT ID: [name]        MRN:  [id number]          DOB/AGE: 02/12/1956 / 62 y.o.    DISCHARGE SUMMARY  ADMISSION DATE:    08/23/2018 DISCHARGE DATE:   08/24/2018   ADMISSION DIAGNOSIS: S/P total knee replacement [Z96.659]    DISCHARGE DIAGNOSIS:  RT. Knee Osteoaarthritis    ADDITIONAL DIAGNOSIS: Active Problems:   S/P total knee replacement  Past Medical History:  Diagnosis Date  . Anxiety   . Arthritis    OA  . GERD (gastroesophageal reflux disease)   . Headache    HX OF MIGRAINES YEARS AGO  . Hypertension   . Sleep apnea    HX OF HAD SURGERY FOR YEARS AGO    PROCEDURE: Procedure(s): TOTAL KNEE ARTHROPLASTY on 08/23/2018  CONSULTS:    HISTORY:  See H&P in chart  HOSPITAL COURSE:  [name] is a 62 y.o. admitted on 08/23/2018 and found to have a diagnosis of RT. Knee Osteoaarthritis.  After appropriate laboratory studies were obtained  they were taken to the operating room on 08/23/2018 and underwent Procedure(s): TOTAL KNEE ARTHROPLASTY.   They were given perioperative antibiotics:  Anti-infectives (From admission, onward)   Start     Dose/Rate Route Frequency Ordered Stop   08/23/18 1700  ceFAZolin (ANCEF) IVPB 2g/100 mL premix     2 g 200 mL/hr over 30 Minutes Intravenous Every 6 hours 08/23/18 1525 08/23/18 2313   08/23/18 0845  ceFAZolin (ANCEF) IVPB 2g/100 mL premix     2 g 200 mL/hr over 30 Minutes Intravenous On call to O.R. 08/23/18 2130 08/23/18 1058    .  Patient given tranexamic acid IV or topical and exparel intra-operatively.  Tolerated the procedure well.    POD# 1: Vital signs were stable.  Patient denied Chest pain, shortness of breath, or calf pain.  Patient was started on Aspirin twice daily at 8am.  Consults to PT, OT, and care management were made.  The  patient was weight bearing as tolerated.  CPM was placed on the operative leg 0-90 degrees for 6-8 hours a day. When out of the CPM, patient was placed in the foam block to achieve full extension. Incentive spirometry was taught.  Dressing was changed.       POD #2, Continued  PT for ambulation and exercise program.  IV saline locked.  O2 discontinued.    The remainder of the hospital course was dedicated to ambulation and strengthening.   The patient was discharged on 1 Day Post-Op in  Good condition.  Blood products given:none  DIAGNOSTIC STUDIES: Recent vital signs:  Patient Vitals for the past 24 hrs:  BP Temp Temp src Pulse Resp SpO2  08/24/18 0941 140/75 97.8 F (36.6 C) Oral 76 - -  08/24/18 0639 133/78 98.4 F (36.9 C) Oral 73 17 96 %  08/24/18 0303 (!) 156/87 98.7 F (37.1 C) Oral 84 16 98 %  08/23/18 2219 (!) 144/80 98.2 F (36.8 C) Oral 76 15 98 %  08/23/18 2033 137/71 - - 78 - 98 %  08/23/18 1830 136/77 - - 70 14 100 %  08/23/18 1630 (!)  149/79 (!) 97.5 F (36.4 C) Oral 62 14 100 %  08/23/18 1523 126/76 - - 70 14 100 %  08/23/18 1500 114/71 - - 66 14 95 %  08/23/18 1452 109/74 97.8 F (36.6 C) - 64 14 95 %  08/23/18 1445 114/69 - - 60 14 100 %  08/23/18 1430 103/65 - - 61 16 100 %  08/23/18 1415 106/64 - - 63 14 94 %  08/23/18 1400 131/74 - - 76 12 100 %  08/23/18 1345 115/67 - - 66 12 99 %  08/23/18 1330 95/62 - - 68 11 99 %  08/23/18 1315 113/64 - - 79 11 100 %  08/23/18 1300 96/64 - - 81 18 99 %  08/23/18 1245 92/72 - - 85 12 97 %  08/23/18 1241 102/64 97.8 F (36.6 C) - 86 20 95 %       Recent laboratory studies: Recent Labs    08/19/18 1609 08/24/18 0456  WBC 10.5 13.5*  HGB 13.5 12.4  HCT 42.3 39.9  PLT 247 233   Recent Labs    08/19/18 1609 08/24/18 0456  NA 145 139  K 3.6 3.7  CL 107 103  CO2 30 30  BUN 22* 14  CREATININE 0.70 0.63  GLUCOSE 101* 145*  CALCIUM 9.3 8.5*   No results found for: INR, PROTIME   Recent Radiographic  Studies :  No results found.  DISCHARGE INSTRUCTIONS: Discharge Instructions    Call MD / Call 911   Complete by:  As directed    If you experience chest pain or shortness of breath, CALL 911 and be transported to the hospital emergency room.  If you develope a fever above 101 F, pus (white drainage) or increased drainage or redness at the wound, or calf pain, call your surgeon's office.   Constipation Prevention   Complete by:  As directed    Drink plenty of fluids.  Prune juice may be helpful.  You may use a stool softener, such as Colace (over the counter) 100 mg twice a day.  Use MiraLax (over the counter) for constipation as needed.   Diet - low sodium heart healthy   Complete by:  As directed    Discharge instructions   Complete by:  As directed    INSTRUCTIONS AFTER JOINT REPLACEMENT   Remove items at home which could result in a fall. This includes throw rugs or furniture in walking pathways ICE to the affected joint every three hours while awake for 30 minutes at a time, for at least the first 3-5 days, and then as needed for pain and swelling.  Continue to use ice for pain and swelling. You may notice swelling that will progress down to the foot and ankle.  This is normal after surgery.  Elevate your leg when you are not up walking on it.   Continue to use the breathing machine you got in the hospital (incentive spirometer) which will help keep your temperature down.  It is common for your temperature to cycle up and down following surgery, especially at night when you are not up moving around and exerting yourself.  The breathing machine keeps your lungs expanded and your temperature down.   DIET:  As you were doing prior to hospitalization, we recommend a well-balanced diet.  DRESSING / WOUND CARE / SHOWERING  Keep the surgical dressing until follow up.  The dressing is water proof, so you can shower without any extra covering.  IF THE DRESSING  FALLS OFF or the wound gets wet  inside, change the dressing with sterile gauze.  Please use good hand washing techniques before changing the dressing.  Do not use any lotions or creams on the incision until instructed by your surgeon.    ACTIVITY  Increase activity slowly as tolerated, but follow the weight bearing instructions below.   No driving for 6 weeks or until further direction given by your physician.  You cannot drive while taking narcotics.  No lifting or carrying greater than 10 lbs. until further directed by your surgeon. Avoid periods of inactivity such as sitting longer than an hour when not asleep. This helps prevent blood clots.  You may return to work once you are authorized by your doctor.     WEIGHT BEARING   Weight bearing as tolerated with assist device (walker, cane, etc) as directed, use it as long as suggested by your surgeon or therapist, typically at least 4-6 weeks.   EXERCISES  Results after joint replacement surgery are often greatly improved when you follow the exercise, range of motion and muscle strengthening exercises prescribed by your doctor. Safety measures are also important to protect the joint from further injury. Any time any of these exercises cause you to have increased pain or swelling, decrease what you are doing until you are comfortable again and then slowly increase them. If you have problems or questions, call your caregiver or physical therapist for advice.   Rehabilitation is important following a joint replacement. After just a few days of immobilization, the muscles of the leg can become weakened and shrink (atrophy).  These exercises are designed to build up the tone and strength of the thigh and leg muscles and to improve motion. Often times heat used for twenty to thirty minutes before working out will loosen up your tissues and help with improving the range of motion but do not use heat for the first two weeks following surgery (sometimes heat can increase post-operative  swelling).   These exercises can be done on a training (exercise) mat, on the floor, on a table or on a bed. Use whatever works the best and is most comfortable for you.    Use music or television while you are exercising so that the exercises are a pleasant break in your day. This will make your life better with the exercises acting as a break in your routine that you can look forward to.   Perform all exercises about fifteen times, three times per day or as directed.  You should exercise both the operative leg and the other leg as well.   Exercises include:   Quad Sets - Tighten up the muscle on the front of the thigh (Quad) and hold for 5-10 seconds.   Straight Leg Raises - With your knee straight (if you were given a brace, keep it on), lift the leg to 60 degrees, hold for 3 seconds, and slowly lower the leg.  Perform this exercise against resistance later as your leg gets stronger.  Leg Slides: Lying on your back, slowly slide your foot toward your buttocks, bending your knee up off the floor (only go as far as is comfortable). Then slowly slide your foot back down until your leg is flat on the floor again.  Angel Wings: Lying on your back spread your legs to the side as far apart as you can without causing discomfort.  Hamstring Strength:  Lying on your back, push your heel against the floor with your  leg straight by tightening up the muscles of your buttocks.  Repeat, but this time bend your knee to a comfortable angle, and push your heel against the floor.  You may put a pillow under the heel to make it more comfortable if necessary.   A rehabilitation program following joint replacement surgery can speed recovery and prevent re-injury in the future due to weakened muscles. Contact your doctor or a physical therapist for more information on knee rehabilitation.    CONSTIPATION  Constipation is defined medically as fewer than three stools per week and severe constipation as less than one  stool per week.  Even if you have a regular bowel pattern at home, your normal regimen is likely to be disrupted due to multiple reasons following surgery.  Combination of anesthesia, postoperative narcotics, change in appetite and fluid intake all can affect your bowels.   YOU MUST use at least one of the following options; they are listed in order of increasing strength to get the job done.  They are all available over the counter, and you may need to use some, POSSIBLY even all of these options:    Drink plenty of fluids (prune juice may be helpful) and high fiber foods Colace 100 mg by mouth twice a day  Senokot for constipation as directed and as needed Dulcolax (bisacodyl), take with full glass of water  Miralax (polyethylene glycol) once or twice a day as needed.  If you have tried all these things and are unable to have a bowel movement in the first 3-4 days after surgery call either your surgeon or your primary doctor.    If you experience loose stools or diarrhea, hold the medications until you stool forms back up.  If your symptoms do not get better within 1 week or if they get worse, check with your doctor.  If you experience "the worst abdominal pain ever" or develop nausea or vomiting, please contact the office immediately for further recommendations for treatment.   ITCHING:  If you experience itching with your medications, try taking only a single pain pill, or even half a pain pill at a time.  You can also use Benadryl over the counter for itching or also to help with sleep.   TED HOSE STOCKINGS:  Use stockings on both legs until for at least 2 weeks or as directed by physician office. They may be removed at night for sleeping.  MEDICATIONS:  See your medication summary on the "After Visit Summary" that nursing will review with you.  You may have some home medications which will be placed on hold until you complete the course of blood thinner medication.  It is important for you to  complete the blood thinner medication as prescribed.  PRECAUTIONS:  If you experience chest pain or shortness of breath - call 911 immediately for transfer to the hospital emergency department.   If you develop a fever greater that 101 F, purulent drainage from wound, increased redness or drainage from wound, foul odor from the wound/dressing, or calf pain - CONTACT YOUR SURGEON.                                                   FOLLOW-UP APPOINTMENTS:  If you do not already have a post-op appointment, please call the office for an appointment to be seen by  your surgeon.  Guidelines for how soon to be seen are listed in your "After Visit Summary", but are typically between 1-4 weeks after surgery.  OTHER INSTRUCTIONS:   Knee Replacement:  Do not place pillow under knee, focus on keeping the knee straight while resting. CPM instructions: 0-90 degrees, 2 hours in the morning, 2 hours in the afternoon, and 2 hours in the evening. Place foam block, curve side up under heel at all times except when in CPM or when walking.  DO NOT modify, tear, cut, or change the foam block in any way.  MAKE SURE YOU:  Understand these instructions.  Get help right away if you are not doing well or get worse.    Thank you for letting us be a part of your medical care team.  It is a privilege we respect greatly.  We hope these instructions will help you stay on track for a fast and full recovery!   Increase activity slowly as tolerated   Complete by:  As directed       DISCHARGE MEDICATIONS:   Allergies as of 08/24/2018      Reactions   Dilaudid [hydromorphone] Anaphylaxis   Augmentin [amoxicillin-pot Clavulanate] Nausea And Vomiting   Latex Other (See Comments)   *GLOVES - breaks skin out    Norco [hydrocodone-acetaminophen] Nausea And Vomiting      Medication List    TAKE these medications   acetaminophen 500 MG tablet Commonly known as:  TYLENOL Take 1,000 mg by mouth every 6 (six) hours as  needed.   ALPRAZolam 0.5 MG tablet Commonly known as:  XANAX TAKE 1 TABLET THREE TIMES DAILY AS NEEDED FOR ANXIETY What changed:  See the new instructions.   aspirin 325 MG EC tablet Take 1 tablet (325 mg total) by mouth 2 (two) times daily.   hydrochlorothiazide 25 MG tablet Commonly known as:  HYDRODIURIL Take 1 tablet (25 mg total) by mouth daily.   lisinopril 40 MG tablet Commonly known as:  PRINIVIL,ZESTRIL Take 1 tablet (40 mg total) by mouth daily.   methocarbamol 500 MG tablet Commonly known as:  ROBAXIN Take 1-2 tablets (500-1,000 mg total) by mouth every 6 (six) hours as needed for muscle spasms.   oxyCODONE 5 MG immediate release tablet Commonly known as:  Oxy IR/ROXICODONE Take 1-2 tablets (5-10 mg total) by mouth every 6 (six) hours as needed for moderate pain (pain score 4-6).   PRESCRIPTION MEDICATION Take 3 tablets by mouth daily. Cosamine ASU            Durable Medical Equipment  (From admission, onward)         Start     Ordered   08/23/18 1526  DME Walker rolling  Once    Question:  Patient needs a walker to treat with the following condition  Answer:  S/P total knee replacement   08/23/18 1525   08/23/18 1526  DME 3 n 1  Once     08/23/18 1525   08/23/18 1526  DME Bedside commode  Once    Question:  Patient needs a bedside commode to treat with the following condition  Answer:  S/P total knee replacement   08/23/18 1525          FOLLOW UP VISIT:    DISPOSITION: HOME VS. SNF  CONDITION:  Good   Guy Sandifer 08/24/2018, 12:22 PM

## 2018-08-24 NOTE — Progress Notes (Signed)
SPORTS MEDICINE AND JOINT REPLACEMENT  Georgena Spurling, MD    Laurier Nancy, PA-C [address] [place], [place], [place]  [address]                             [phone]   PROGRESS NOTE  Subjective:  negative for Chest Pain  negative for Shortness of Breath  negative for Nausea/Vomiting   negative for Calf Pain  negative for Bowel Movement   Tolerating Diet: yes         Patient reports pain as 5 on 0-10 scale.    Objective: Vital signs in last 24 hours:    Patient Vitals for the past 24 hrs:  BP Temp Temp src Pulse Resp SpO2 Height Weight  08/24/18 0639 133/78 98.4 F (36.9 C) Oral 73 17 96 % - -  08/24/18 0303 (!) 156/87 98.7 F (37.1 C) Oral 84 16 98 % - -  08/23/18 2219 (!) 144/80 98.2 F (36.8 C) Oral 76 15 98 % - -  08/23/18 2033 137/71 - - 78 - 98 % - -  08/23/18 1830 136/77 - - 70 14 100 % - -  08/23/18 1630 (!) 149/79 (!) 97.5 F (36.4 C) Oral 62 14 100 % - -  08/23/18 1523 126/76 - - 70 14 100 % - -  08/23/18 1500 114/71 - - 66 14 95 % - -  08/23/18 1452 109/74 97.8 F (36.6 C) - 64 14 95 % - -  08/23/18 1445 114/69 - - 60 14 100 % - -  08/23/18 1430 103/65 - - 61 16 100 % - -  08/23/18 1415 106/64 - - 63 14 94 % - -  08/23/18 1400 131/74 - - 76 12 100 % - -  08/23/18 1345 115/67 - - 66 12 99 % - -  08/23/18 1330 95/62 - - 68 11 99 % - -  08/23/18 1315 113/64 - - 79 11 100 % - -  08/23/18 1300 96/64 - - 81 18 99 % - -  08/23/18 1245 92/72 - - 85 12 97 % - -  08/23/18 1241 102/64 97.8 F (36.6 C) - 86 20 95 % - -  08/23/18 1042 - - - 77 17 100 % - -  08/23/18 1040 - - - 64 15 98 % - -  08/23/18 1036 - - - 66 14 99 % - -  08/23/18 1032 - - - 74 16 99 % - -  08/23/18 1030 102/76 - - 72 12 100 % - -  08/23/18 1028 - - - 72 18 99 % - -  08/23/18 1024 - - - 66 14 97 % - -  08/23/18 1021 - - - 68 13 100 % - -  08/23/18 1020 - - - 71 16 99 % - -  08/23/18 1019 - - - 69 16 98 % - -  08/23/18 1018 115/78 - - 68 - 98 % - -  08/23/18 1017 - - - 72 10 98 % - -   08/23/18 1016 - - - 73 15 100 % - -  08/23/18 0855 - - - - - - 5' 3.5" (1.613 m) 101.6 kg  08/23/18 0840 119/76 (!) 97.4 F (36.3 C) Oral 74 20 95 % - -    @flow {1959:LAST@   Intake/Output from previous day:   11/25 0701 - 11/26 0700 In: 3468.4 [P.O.:960; I.V.:2143.4] Out: 850 [Urine:825]  Intake/Output this shift:   No intake/output data recorded.   Intake/Output      11/25 0701 - 11/26 0700 11/26 0701 - 11/27 0700   P.O. 960    I.V. (mL/kg) 2143.4 (21.1)    IV Piggyback 365    Total Intake(mL/kg) 3468.4 (34.1)    Urine (mL/kg/hr) 825    Stool 0    Blood 25    Total Output 850    Net +2618.4            LABORATORY DATA: Recent Labs    08/19/18 1609 08/24/18 0456  WBC 10.5 13.5*  HGB 13.5 12.4  HCT 42.3 39.9  PLT 247 233   Recent Labs    08/19/18 1609 08/24/18 0456  NA 145 139  K 3.6 3.7  CL 107 103  CO2 30 30  BUN 22* 14  CREATININE 0.70 0.63  GLUCOSE 101* 145*  CALCIUM 9.3 8.5*   No results found for: INR, PROTIME  Examination:  General appearance: alert, cooperative and no distress Extremities: extremities normal, atraumatic, no cyanosis or edema  Wound Exam: clean, dry, intact   Drainage:  None: wound tissue dry  Motor Exam: Quadriceps and Hamstrings Intact  Sensory Exam: Superficial Peroneal, Deep Peroneal and Tibial normal   Assessment:    1 Day Post-Op  Procedure(s) (LRB): TOTAL KNEE ARTHROPLASTY (Right)  ADDITIONAL DIAGNOSIS:  Active Problems:   S/P total knee replacement     Plan: Physical Therapy as ordered Weight Bearing as Tolerated (WBAT)  DVT Prophylaxis:  Aspirin  DISCHARGE PLAN: Home  DISCHARGE NEEDS: HHPT     Patient doing well, progress limited by pain. Probable D/C home today  Patient's anticipated LOS is less than 2 midnights, meeting these requirements: - Younger than 465 - Lives within 1 hour of care - Has a competent adult at home to recover with post-op recover - NO history of  - Chronic pain  requiring opiods  - Diabetes  - Coronary Artery Disease  - Heart failure  - Heart attack  - Stroke  - DVT/VTE  - Cardiac arrhythmia  - Respiratory Failure/COPD  - Renal failure  - Anemia  - Advanced Liver disease        Guy SandiferColby Alan Robbins 08/24/2018, 7:01 AM

## 2018-08-24 NOTE — Plan of Care (Signed)

## 2018-08-25 DIAGNOSIS — M1711 Unilateral primary osteoarthritis, right knee: Secondary | ICD-10-CM | POA: Diagnosis not present

## 2018-08-25 LAB — CBC
HCT: 41.1 % (ref 36.0–46.0)
HEMOGLOBIN: 13 g/dL (ref 12.0–15.0)
MCH: 28.8 pg (ref 26.0–34.0)
MCHC: 31.6 g/dL (ref 30.0–36.0)
MCV: 91.1 fL (ref 80.0–100.0)
NRBC: 0 % (ref 0.0–0.2)
Platelets: 300 10*3/uL (ref 150–400)
RBC: 4.51 MIL/uL (ref 3.87–5.11)
RDW: 13.7 % (ref 11.5–15.5)
WBC: 15.5 10*3/uL — AB (ref 4.0–10.5)

## 2018-08-25 NOTE — Progress Notes (Signed)
No dizziness or nausea overnight. Vitals stable.

## 2018-08-25 NOTE — Progress Notes (Signed)
Physical Therapy Treatment Patient Details Name: [name] MRN: [id number] DOB: [birth year] Today's Date: 08/25/2018    History of Present Illness 62 yo female s/p R TKR on 08/23/18. PMH includes HTN, anxiety, depression, HLD, OA, sleep apnea, R knee arthroscopy x2.     PT Comments    Patient improved with activity tolerance this pm.  Remains slow, but improved independence with mobility and tolerance to activity.  Agree with home with family support this pm with follow up PT.    Follow Up Recommendations  Follow surgeon's recommendation for DC plan and follow-up therapies;Supervision for mobility/OOB     Equipment Recommendations  None recommended by PT    Recommendations for Other Services       Precautions / Restrictions Precautions Precautions: Fall Restrictions Other Position/Activity Restrictions: WBAT    Mobility  Bed Mobility Overal bed mobility: Needs Assistance Bed Mobility: Supine to Sit;Sit to Supine     Supine to sit: Supervision;HOB elevated Sit to supine: Min assist   General bed mobility comments: used L leg to assist with R  Transfers Overall transfer level: Needs assistance Equipment used: Rolling walker (2 wheeled) Transfers: Sit to/from Stand Sit to Stand: Supervision         General transfer comment: cues for hand placement  Ambulation/Gait Ambulation/Gait assistance: Supervision Gait Distance (Feet): 100 Feet Assistive device: Rolling walker (2 wheeled) Gait Pattern/deviations: Step-to pattern;Decreased stride length;Antalgic     General Gait Details: mobilizing with less pain, quicker pace   Stairs             Wheelchair Mobility    Modified Rankin (Stroke Patients Only)       Balance Overall balance assessment: Needs assistance   Sitting balance-Leahy Scale: Good       Standing balance-Leahy Scale: Fair                              Cognition Arousal/Alertness: Awake/alert Behavior During  Therapy: WFL for tasks assessed/performed Overall Cognitive Status: Within Functional Limits for tasks assessed                                        Exercises Total Joint Exercises Heel Slides: AROM;10 reps;Seated;Right(foot on floor) Long Arc Quad: AAROM;5 reps;Right;Seated    General Comments General comments (skin integrity, edema, etc.): family present to assist to get home      Pertinent Vitals/Pain Faces Pain Scale: Hurts even more Pain Location: R knee  Pain Descriptors / Indicators: Burning;Aching Pain Intervention(s): Monitored during session;Repositioned    Home Living                      Prior Function            PT Goals (current goals can now be found in the care plan section) Progress towards PT goals: Progressing toward goals    Frequency    7X/week      PT Plan Current plan remains appropriate    Co-evaluation              AM-PAC PT "6 Clicks" Mobility   Outcome Measure  Help needed turning from your back to your side while in a flat bed without using bedrails?: A Little Help needed moving from lying on your back to sitting on the side of a flat bed without using  bedrails?: A Little Help needed moving to and from a bed to a chair (including a wheelchair)?: A Little Help needed standing up from a chair using your arms (e.g., wheelchair or bedside chair)?: A Little Help needed to walk in hospital room?: A Little Help needed climbing 3-5 steps with a railing? : A Little 6 Click Score: 18    End of Session Equipment Utilized During Treatment: Gait belt Activity Tolerance: Patient tolerated treatment well Patient left: with call bell/phone within reach;in bed;with family/visitor present Nurse Communication: Mobility status PT Visit Diagnosis: Other abnormalities of gait and mobility (R26.89);Difficulty in walking, not elsewhere classified (R26.2)     Time: [id number] PT Time Calculation (min) (ACUTE ONLY): 20  min  Charges:  $Gait Training: 8-22 mins                     Dawn LawlessCyndi Aurelia Tapia, [place] CarolinaPT Acute Rehabilitation Services [phone] 08/25/2018    [name] [name] 08/25/2018, 3:24 PM

## 2018-08-25 NOTE — Progress Notes (Signed)
Physical Therapy Treatment Patient Details Name: Dawn JewelsDebra G Tapia MRN: [id number] DOB: 05/16/1956 Today's Date: 08/25/2018    History of Present Illness 62 yo female s/p R TKR on 08/23/18. PMH includes HTN, anxiety, depression, HLD, OA, sleep apnea, R knee arthroscopy x2.     PT Comments    Patient progressing slowly due to pain, nausea (with dry heaves during ambulation), and weakness.  Feel she will benefit from one more session in pm prior to d/c home.     Follow Up Recommendations  Follow surgeon's recommendation for DC plan and follow-up therapies;Supervision for mobility/OOB     Equipment Recommendations  None recommended by PT    Recommendations for Other Services       Precautions / Restrictions Precautions Precautions: Fall Restrictions Other Position/Activity Restrictions: WBAT    Mobility  Bed Mobility Overal bed mobility: Needs Assistance Bed Mobility: Supine to Sit;Sit to Supine     Supine to sit: HOB elevated;Min guard Sit to supine: Min assist   General bed mobility comments: educated in use of belt to assist R LE and pt able to use, but needed support for trunk coming up to sit; to supine assist for leg due to fatigue  Transfers Overall transfer level: Needs assistance Equipment used: Rolling walker (2 wheeled) Transfers: Sit to/from Stand Sit to Stand: Min guard            Ambulation/Gait Ambulation/Gait assistance: Supervision;Min guard Gait Distance (Feet): 75 Feet Assistive device: Rolling walker (2 wheeled) Gait Pattern/deviations: Step-to pattern;Decreased stride length;Antalgic     General Gait Details: patient fatigued and with dry heaves during ambulation.  Noted pallor and pt felt pre-syncopal, BP once seated 125/75   Stairs             Wheelchair Mobility    Modified Rankin (Stroke Patients Only)       Balance Overall balance assessment: Needs assistance   Sitting balance-Leahy Scale: Good       Standing  balance-Leahy Scale: Fair                              Cognition Arousal/Alertness: Awake/alert Behavior During Therapy: WFL for tasks assessed/performed Overall Cognitive Status: Within Functional Limits for tasks assessed                                        Exercises Total Joint Exercises Ankle Circles/Pumps: AROM;10 reps;Both;Supine Quad Sets: AROM;10 reps;Right;Supine Short Arc Quad: AROM;AAROM;10 reps;Right;Supine Heel Slides: AROM;10 reps;Right;Supine;AAROM Hip ABduction/ADduction: AROM;10 reps;Right;Supine Straight Leg Raises: AROM;Right;Supine;10 reps Long Arc Quad: PROM    General Comments        Pertinent Vitals/Pain Faces Pain Scale: Hurts even more Pain Location: R knee  Pain Descriptors / Indicators: Burning;Aching Pain Intervention(s): Monitored during session;Repositioned;Ice applied    Home Living                      Prior Function            PT Goals (current goals can now be found in the care plan section) Progress towards PT goals: Progressing toward goals(slow due to nausea, weakness)    Frequency    7X/week      PT Plan Current plan remains appropriate    Co-evaluation              AM-PAC PT "  6 Clicks" Mobility   Outcome Measure  Help needed turning from your back to your side while in a flat bed without using bedrails?: A Little Help needed moving from lying on your back to sitting on the side of a flat bed without using bedrails?: A Little Help needed moving to and from a bed to a chair (including a wheelchair)?: A Little Help needed standing up from a chair using your arms (e.g., wheelchair or bedside chair)?: A Little Help needed to walk in hospital room?: A Little Help needed climbing 3-5 steps with a railing? : A Little 6 Click Score: 18    End of Session Equipment Utilized During Treatment: Gait belt Activity Tolerance: Patient limited by fatigue Patient left: with call  bell/phone within reach;in bed Nurse Communication: Mobility status PT Visit Diagnosis: Other abnormalities of gait and mobility (R26.89);Difficulty in walking, not elsewhere classified (R26.2)     Time: 1914-7829 PT Time Calculation (min) (ACUTE ONLY): 40 min  Charges:  $Gait Training: 8-22 mins $Therapeutic Exercise: 8-22 mins $Therapeutic Activity: 8-22 mins                     Sheran Lawless, [facility] [phone] 08/25/2018    Elray Mcgregor 08/25/2018, 12:46 PM

## 2018-08-25 NOTE — Progress Notes (Signed)
Reviewed discharge instructions, medications and prescriptions. Patient verbalizes understanding and has no further questions. Per Lestine Boxolby Robbins,PA patient is ready to be discharged without anything further today.

## 2018-09-13 ENCOUNTER — Other Ambulatory Visit: Payer: Self-pay | Admitting: Orthopedic Surgery

## 2018-09-13 DIAGNOSIS — Z96651 Presence of right artificial knee joint: Secondary | ICD-10-CM

## 2018-09-14 ENCOUNTER — Ambulatory Visit
Admission: RE | Admit: 2018-09-14 | Discharge: 2018-09-14 | Disposition: A | Discharge status: Home / Self Care | Payer: 59 | Source: Ambulatory Visit | Attending: Orthopedic Surgery | Admitting: Orthopedic Surgery

## 2018-09-14 ENCOUNTER — Other Ambulatory Visit: Payer: Self-pay | Admitting: Orthopedic Surgery

## 2018-09-14 DIAGNOSIS — Z96651 Presence of right artificial knee joint: Secondary | ICD-10-CM

## 2019-04-15 ENCOUNTER — Other Ambulatory Visit: Payer: Self-pay | Admitting: Orthopedic Surgery

## 2019-04-15 DIAGNOSIS — M25561 Pain in right knee: Secondary | ICD-10-CM

## 2019-04-15 DIAGNOSIS — M25461 Effusion, right knee: Secondary | ICD-10-CM

## 2019-04-15 DIAGNOSIS — Z96651 Presence of right artificial knee joint: Secondary | ICD-10-CM

## 2019-04-20 ENCOUNTER — Other Ambulatory Visit: Payer: 59

## 2019-05-10 ENCOUNTER — Other Ambulatory Visit: Payer: Self-pay | Admitting: Family Medicine

## 2019-05-10 DIAGNOSIS — Z1231 Encounter for screening mammogram for malignant neoplasm of breast: Secondary | ICD-10-CM

## 2019-06-21 ENCOUNTER — Ambulatory Visit: Payer: 59

## 2019-06-22 ENCOUNTER — Other Ambulatory Visit: Payer: Self-pay

## 2019-06-22 ENCOUNTER — Ambulatory Visit (INDEPENDENT_AMBULATORY_CARE_PROVIDER_SITE_OTHER): Payer: 59

## 2019-06-22 DIAGNOSIS — Z1231 Encounter for screening mammogram for malignant neoplasm of breast: Secondary | ICD-10-CM

## 2020-02-28 HISTORY — PX: JOINT REPLACEMENT: SHX530

## 2020-04-30 ENCOUNTER — Emergency Department (INDEPENDENT_AMBULATORY_CARE_PROVIDER_SITE_OTHER)
Admission: EM | Admit: 2020-04-30 | Discharge: 2020-04-30 | Disposition: A | Discharge status: Home / Self Care | Payer: 59 | Source: Home / Self Care

## 2020-04-30 ENCOUNTER — Emergency Department (INDEPENDENT_AMBULATORY_CARE_PROVIDER_SITE_OTHER): Payer: 59

## 2020-04-30 ENCOUNTER — Encounter: Payer: Self-pay | Admitting: Emergency Medicine

## 2020-04-30 ENCOUNTER — Other Ambulatory Visit: Payer: Self-pay

## 2020-04-30 DIAGNOSIS — M25511 Pain in right shoulder: Secondary | ICD-10-CM

## 2020-04-30 NOTE — ED Provider Notes (Signed)
[facility]    CSN: [id number] Arrival date & time: 04/30/20  1209      History   Chief Complaint Chief Complaint  Patient presents with  . Shoulder Pain    HPI [name] is a 64 y.o. female.   HPI [name] is a 64 y.o. female presenting to UC with c/o Right anterior shoulder pain that started about 3 weeks ago. Pt had knee surgery on 6/10, about 10 days later, while trying to get into her normal bed that is elevated several feet off the floor, she used her Right arm to help pull herself up; she felt a pop and has had pain since then.  She has tried heat and ibuprofen w/o relief. Movement makes pain worse. Limited movement behind her back to latch her bra.  Last dose of medication ibuprofen at 8:30AM.     Past Medical History:  Diagnosis Date  . Anxiety   . Arthritis    OA  . GERD (gastroesophageal reflux disease)   . Headache    HX OF MIGRAINES YEARS AGO  . Hypertension   . Sleep apnea    HX OF HAD SURGERY FOR YEARS AGO    Patient Active Problem List   Diagnosis Date Noted  . S/P total knee replacement 08/23/2018  . Carpal tunnel syndrome on right 07/20/2018  . Lateral epicondylitis of right elbow 07/20/2018  . Pure hypercholesterolemia 06/01/2018  . Arthritis of carpometacarpal [facility]) joint of right thumb 01/08/2018  . Breast calcification, right 11/30/2016  . Right knee pain 10/06/2016  . Plantar fasciitis 11/28/2015  . Acquired equinus deformity of foot 11/28/2015  . Depression 11/28/2015  . Hypertension 11/28/2015  . Migraine headache 11/28/2015  . Essential hypertension, benign 12/05/2014  . Rhinitis, allergic 12/05/2014  . Acute anxiety 12/05/2014  . Obesity 12/05/2014  . Abnormal weight gain 12/05/2014  . Hyperlipidemia 12/05/2014    Past Surgical History:  Procedure Laterality Date  . ABDOMINAL HYSTERECTOMY     OVARIES REMOVED LATER  . ARTHROSOPIC KNEE SURGERY Right 2010, APRIL 2017  . BONE SPURS REMOVED FROM BOTH FEET    .  CHOLECYSTECTOMY     LAPROSCOPIC  . JOINT REPLACEMENT Left 02/2020   knee  . JOINT REPLACEMENT Right 07/2018   knee  . NASAL SINUS SURGERY  YRS AGO  . THROAT SURGERY  YRS AGO   TO ENLARGE TTHROAT OPENING  . TOE NAIL REMOVED RIGHT BIG FOOT    . TOTAL KNEE ARTHROPLASTY Right 08/23/2018   Procedure: TOTAL KNEE ARTHROPLASTY;  Surgeon: Dannielle Huh, MD;  Location: [facility];  Service: Orthopedics;  Laterality: Right;  . TUBAL LIGATION      OB History   No obstetric history on file.      Home Medications    Prior to Admission medications   Medication Sig Start Date End Date Taking? Authorizing Provider  hydrochlorothiazide (HYDRODIURIL) 25 MG tablet Take 1 tablet (25 mg total) by mouth daily. 12/11/16  Yes Breeback, Jade L, PA-C  lisinopril (PRINIVIL,ZESTRIL) 40 MG tablet Take 1 tablet (40 mg total) by mouth daily. 12/11/16  Yes Breeback, Jade L, PA-C  phentermine (ADIPEX-P) 37.5 MG tablet Take 37.5 mg by mouth every morning. 04/21/20  Yes [provider]  acetaminophen (TYLENOL) 500 MG tablet Take 1,000 mg by mouth every 6 (six) hours as needed.    [provider]  ALPRAZolam (XANAX) 0.5 MG tablet TAKE 1 TABLET THREE TIMES DAILY AS NEEDED FOR ANXIETY Patient taking differently: Take  0.5 mg by mouth 3 (three) times daily as needed for anxiety.  03/27/17   Agapito Games, MD  aspirin EC 325 MG EC tablet Take 1 tablet (325 mg total) by mouth 2 (two) times daily. 08/24/18   Guy Sandifer, PA  methocarbamol (ROBAXIN) 500 MG tablet Take 1-2 tablets (500-1,000 mg total) by mouth every 6 (six) hours as needed for muscle spasms. 08/24/18   Guy Sandifer, PA  oxyCODONE (OXY IR/ROXICODONE) 5 MG immediate release tablet Take 1-2 tablets (5-10 mg total) by mouth every 6 (six) hours as needed for moderate pain (pain score 4-6). 08/24/18   Guy Sandifer, PA  PRESCRIPTION MEDICATION Take 3 tablets by mouth daily. Cosamine ASU    [provider]  valACYclovir  (VALTREX) 1000 MG tablet TAKE TWO TABLETS BY MOUTH AT FIRST SIGN OF COLD SORE AND TWO TABLETS 12 HOURS LATER 05/09/19   [provider]    Family History Family History  Problem Relation Age of Onset  . Cancer Mother        lung  . Hypertension Mother   . Cancer Paternal Aunt        ovarian  . Hypertension Paternal Aunt   . Diabetes Maternal Grandmother   . Hypertension Maternal Grandmother   . Diabetes Paternal Grandmother   . Hypertension Paternal Grandmother   . Diabetes Paternal Grandfather   . Hypertension Paternal Grandfather     Social History Social History   Tobacco Use  . Smoking status: Former Smoker    Packs/day: 1.50    Years: 25.00    Pack years: 37.50    Types: Cigarettes  . Smokeless tobacco: Never Used  . Tobacco comment: QUIT 20 YRS AGO  Vaping Use  . Vaping Use: Never used  Substance Use Topics  . Alcohol use: Not Currently    Alcohol/week: 0.0 standard drinks  . Drug use: No     Allergies   Dilaudid [hydromorphone], Ranitidine, Augmentin [amoxicillin-pot clavulanate], Latex, and Norco [hydrocodone-acetaminophen]   Review of Systems Review of Systems  Musculoskeletal: Positive for arthralgias. Negative for joint swelling, neck pain and neck stiffness.  Skin: Negative for color change and wound.     Physical Exam Triage Vital Signs ED Triage Vitals  Enc Vitals Group     BP 04/30/20 1225 125/84     Pulse Rate 04/30/20 1225 84     Resp 04/30/20 1225 16     Temp 04/30/20 1225 98.6 F (37 C)     Temp Source 04/30/20 1225 Oral     SpO2 04/30/20 1225 97 %     Weight --      Height --      Head Circumference --      Peak Flow --      Pain Score 04/30/20 1226 4     Pain Loc --      Pain Edu? --      Excl. in GC? --    No data found.  Updated Vital Signs BP 125/84 (BP Location: Left Arm)   Pulse 84   Temp 98.6 F (37 C) (Oral)   Resp 16   SpO2 97%   Visual Acuity Right Eye Distance:   Left Eye Distance:   Bilateral  Distance:    Right Eye Near:   Left Eye Near:    Bilateral Near:     Physical Exam Vitals and nursing note reviewed.  Constitutional:      Appearance: Normal appearance. She is well-developed.  HENT:     Head: Normocephalic and atraumatic.  Cardiovascular:     Rate and Rhythm: Normal rate and regular rhythm.     Pulses:          Radial pulses are 2+ on the right side.  Pulmonary:     Effort: Pulmonary effort is normal.  Musculoskeletal:        General: Tenderness present.     Cervical back: Normal range of motion and neck supple. No tenderness.     Comments: Right shoulder: no deformity, limited extension, tenderness over AC joint and biceps groove. No crepitus.  Full ROM elbow, 5/5 grip strength. No spinal tenderness.  Skin:    General: Skin is warm and dry.     Findings: No bruising or erythema.  Neurological:     Mental Status: She is alert and oriented to person, place, and time.  Psychiatric:        Behavior: Behavior normal.      UC Treatments / Results  Labs (all labs ordered are listed, but only abnormal results are displayed) Labs Reviewed - No data to display  EKG   Radiology DG Shoulder Right  Result Date: 04/30/2020 CLINICAL DATA:  Acute right shoulder pain after injury several weeks ago. EXAM: RIGHT SHOULDER - 2+ VIEW COMPARISON:  None. FINDINGS: There is no evidence of fracture or dislocation. There is no evidence of arthropathy or other focal bone abnormality. Soft tissues are unremarkable. IMPRESSION: Negative. Electronically Signed   By: Lupita Raider M.D.   On: 04/30/2020 12:56    Procedures Procedures (including critical care time)  Medications Ordered in UC Medications - No data to display  Initial Impression / Assessment and Plan / UC Course  I have reviewed the triage vital signs and the nursing notes.  Pertinent labs & imaging results that were available during my care of the patient were reviewed by me and considered in my medical  decision making (see chart for details).     Discussed imaging with pt Encouraged f/u with Sports Medicine/Orthopedist Discussed steroid shot today, however, pt received Covid vaccine 2 days ago, will hold off for now.  AVS provided.  Final Clinical Impressions(s) / UC Diagnoses   Final diagnoses:  Acute pain of right shoulder     Discharge Instructions     You may take 500mg  acetaminophen every 4-6 hours or in combination with ibuprofen 400-600mg  every 6-8 hours as needed for pain and inflammation. See additional treatment recommendations in this packet.   Call to schedule a follow up appointment with Sports Medicine later this week or early next week if not improving.     ED Prescriptions    None     I have reviewed the PDMP during this encounter.   , Lurene Shadow 04/30/20 1742

## 2020-04-30 NOTE — ED Triage Notes (Signed)
R shoulder pain for approx 3 weeks  Pt had her left knee replaced on 6/10 and 10 days after surgery she leaned onto her Right side getting into bed - felt it pop No relief with heat or ibuprofen  Ibuprofen 600mg  at 0830 COVID vaccine this past Saturday to left arm

## 2020-04-30 NOTE — Discharge Instructions (Signed)
You may take 500mg  acetaminophen every 4-6 hours or in combination with ibuprofen 400-600mg  every 6-8 hours as needed for pain and inflammation. See additional treatment recommendations in this packet.   Call to schedule a follow up appointment with Sports Medicine later this week or early next week if not improving.

## 2020-05-21 ENCOUNTER — Other Ambulatory Visit: Payer: Self-pay | Admitting: Family Medicine

## 2020-05-21 DIAGNOSIS — Z1231 Encounter for screening mammogram for malignant neoplasm of breast: Secondary | ICD-10-CM

## 2020-06-28 ENCOUNTER — Other Ambulatory Visit: Payer: Self-pay

## 2020-06-28 ENCOUNTER — Ambulatory Visit (INDEPENDENT_AMBULATORY_CARE_PROVIDER_SITE_OTHER): Payer: 59

## 2020-06-28 DIAGNOSIS — Z1231 Encounter for screening mammogram for malignant neoplasm of breast: Secondary | ICD-10-CM

## 2021-08-13 ENCOUNTER — Other Ambulatory Visit: Payer: Self-pay | Admitting: Family Medicine

## 2021-08-13 DIAGNOSIS — Z1231 Encounter for screening mammogram for malignant neoplasm of breast: Secondary | ICD-10-CM

## 2021-08-15 ENCOUNTER — Other Ambulatory Visit: Payer: Self-pay

## 2021-08-15 ENCOUNTER — Ambulatory Visit (INDEPENDENT_AMBULATORY_CARE_PROVIDER_SITE_OTHER): Payer: 59

## 2021-08-15 DIAGNOSIS — Z1231 Encounter for screening mammogram for malignant neoplasm of breast: Secondary | ICD-10-CM | POA: Diagnosis not present

## 2023-11-05 IMAGING — MG DIGITAL SCREENING BILAT W/ CAD
4 series · 4 of 4 positions shown · non-contrast
Comparison: Previous exam(s).

CLINICAL DATA: Screening.

EXAM:
DIGITAL SCREENING BILATERAL MAMMOGRAM WITH CAD
TECHNIQUE: Bilateral screening digital craniocaudal and mediolateral oblique
mammograms were obtained. The images were evaluated with
computer-aided detection.

[L MLO]
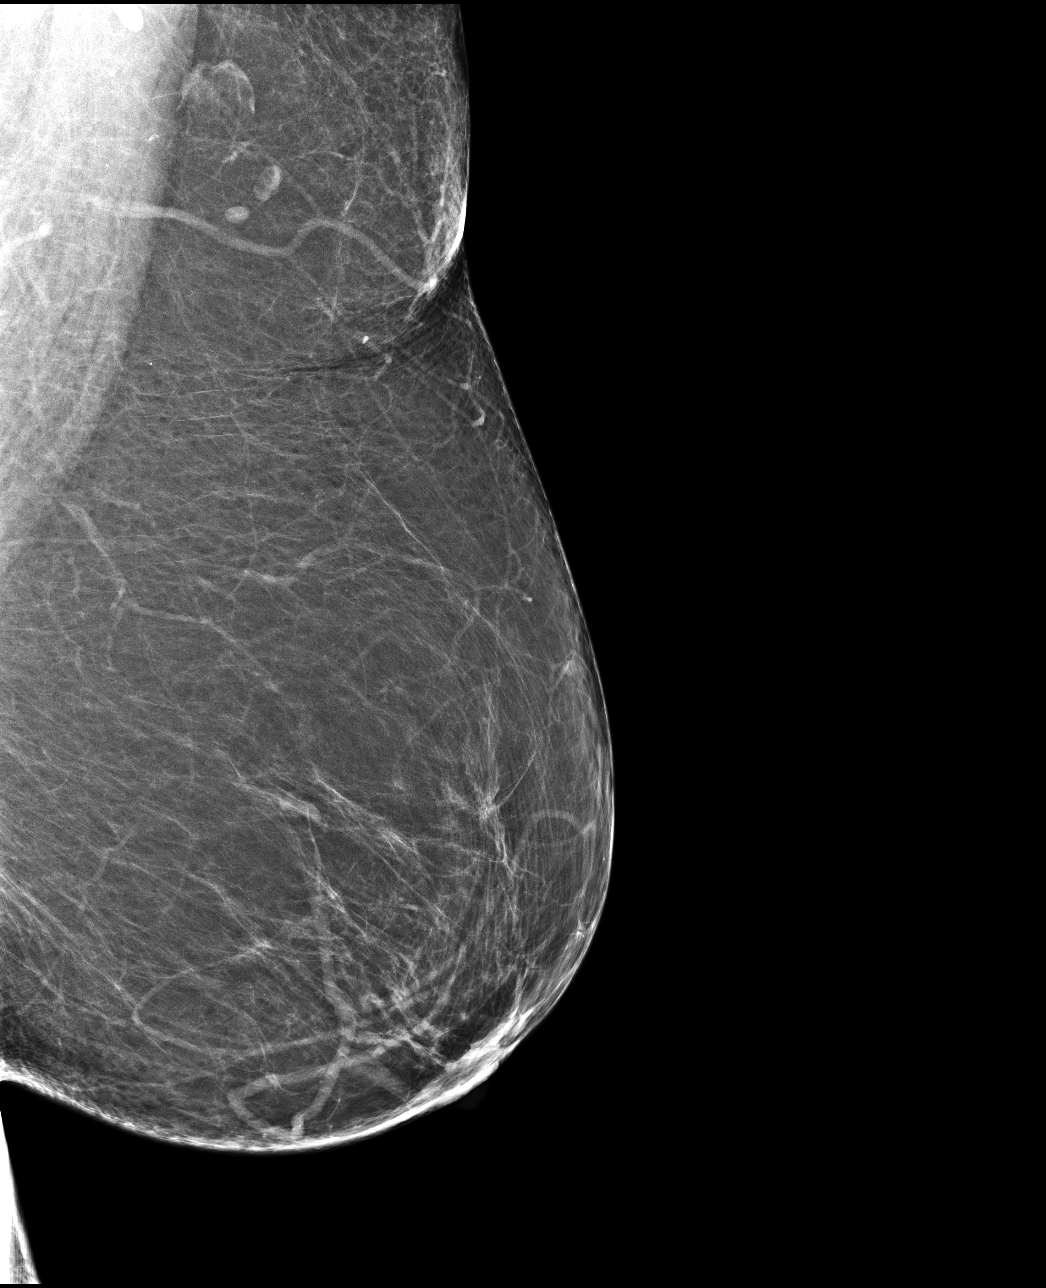

[R MLO]
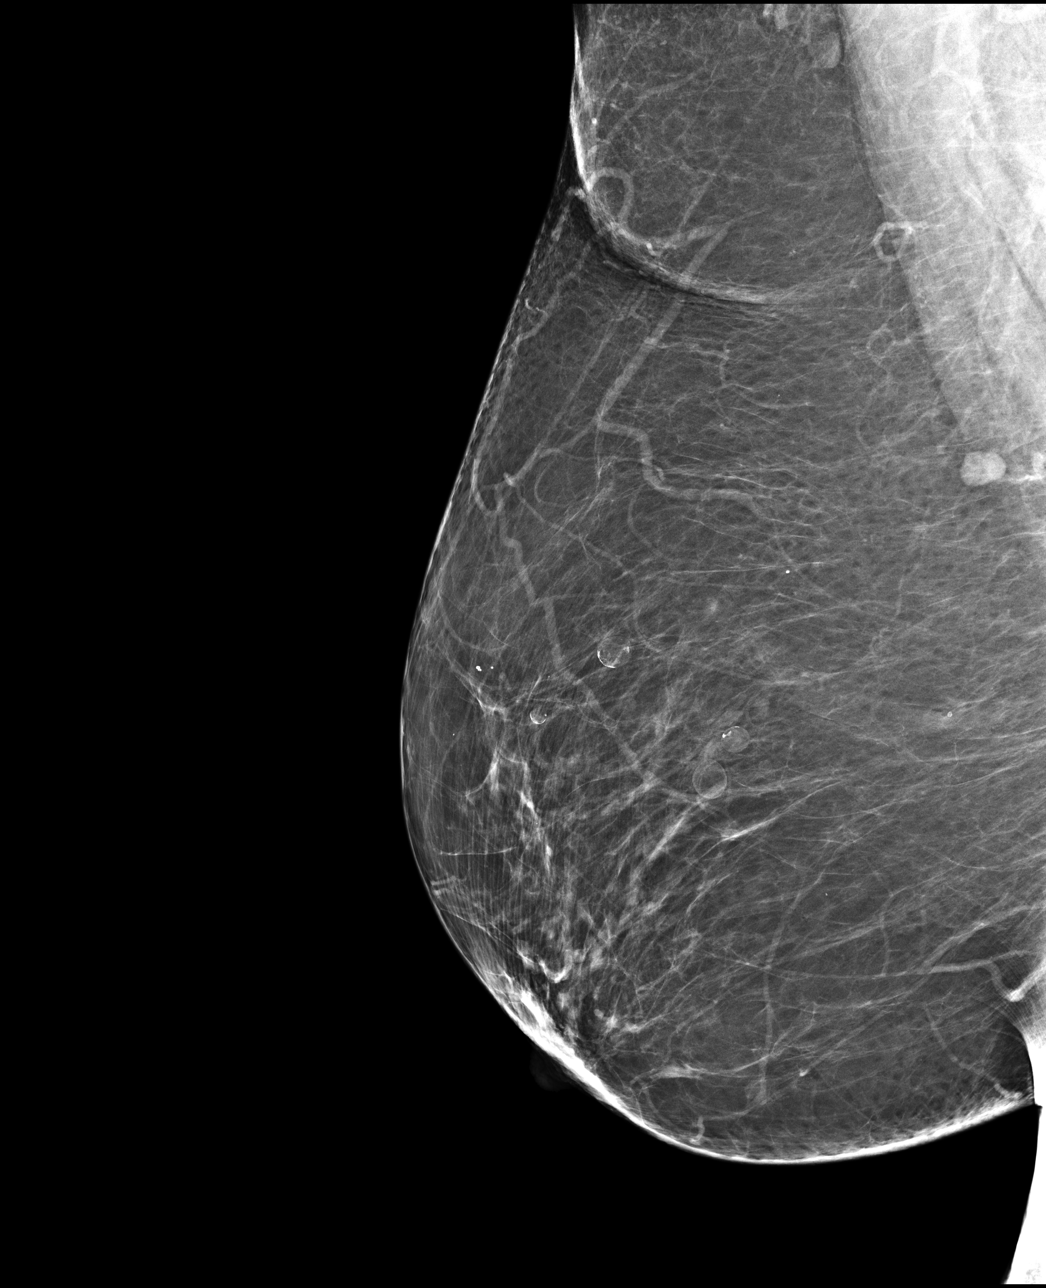

[R CC]
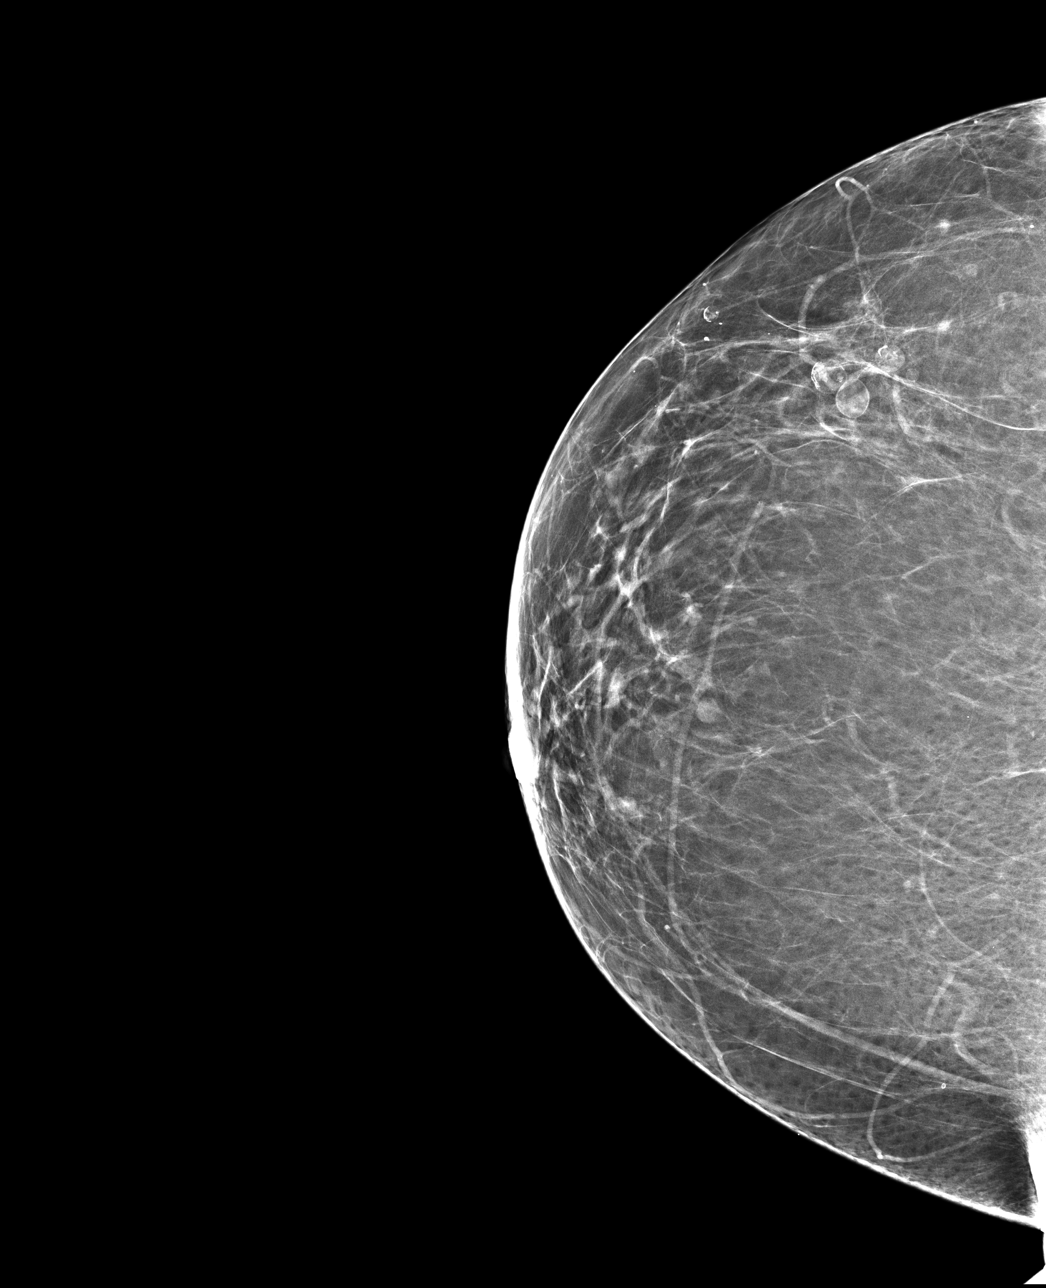

[L CC]
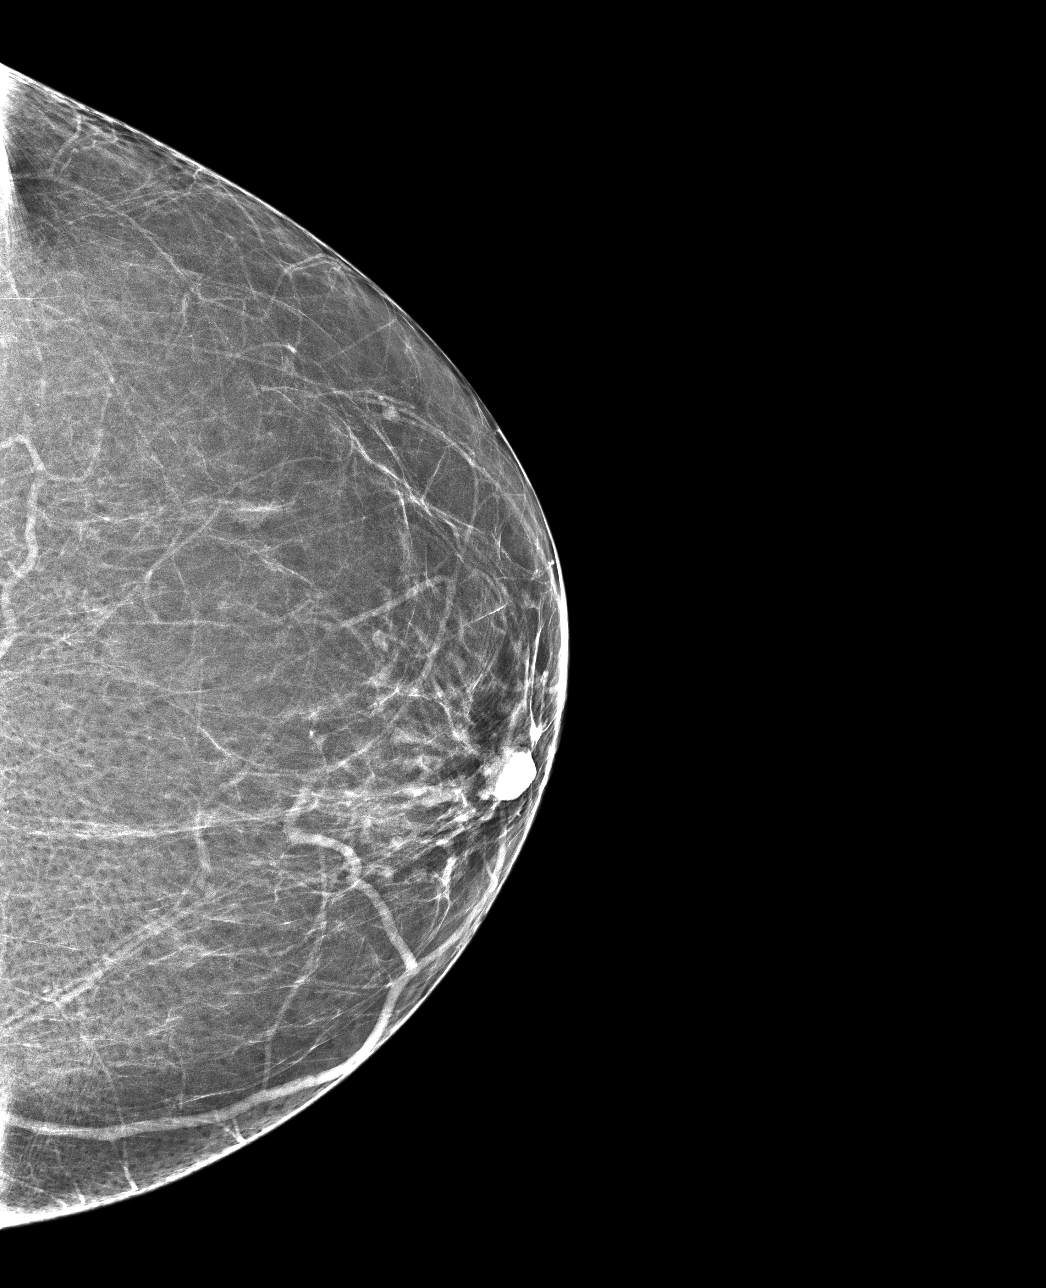

[4 of 4 positions shown; findings below may reference images not displayed]

ACR Breast Density Category b: There are scattered areas of
fibroglandular density.
FINDINGS: There are no findings suspicious for malignancy.
IMPRESSION: No mammographic evidence of malignancy. A result letter of this
screening mammogram will be mailed directly to the patient.

RECOMMENDATION:
Screening mammogram in one year. (Code:[id number])

BI-RADS CATEGORY  1: Negative.
